# Patient Record
Sex: Female | Born: 1985 | Race: Asian | Hispanic: No | Marital: Married | Smoking: Never smoker
Health system: Southern US, Community
[De-identification: ages and names within clinical notes are randomized; demographics above are authoritative.]

## PROBLEM LIST (undated history)

## (undated) ENCOUNTER — Inpatient Hospital Stay: Payer: Self-pay

## (undated) DIAGNOSIS — Z789 Other specified health status: Secondary | ICD-10-CM

## (undated) HISTORY — PX: APPENDECTOMY: SHX54

---

## 2015-07-23 LAB — OB RESULTS CONSOLE RPR: RPR: NONREACTIVE

## 2015-07-23 LAB — OB RESULTS CONSOLE RUBELLA ANTIBODY, IGM: Rubella: IMMUNE

## 2015-07-23 LAB — OB RESULTS CONSOLE HEPATITIS B SURFACE ANTIGEN: Hepatitis B Surface Ag: NEGATIVE

## 2015-07-23 LAB — OB RESULTS CONSOLE HIV ANTIBODY (ROUTINE TESTING): HIV: NONREACTIVE

## 2015-07-23 LAB — OB RESULTS CONSOLE VARICELLA ZOSTER ANTIBODY, IGG: Varicella: IMMUNE

## 2016-01-21 NOTE — L&D Delivery Note (Signed)
Delivery Note At 5:01 AM a viable female was delivered via Vaginal, Spontaneous Delivery (Presentation: ROA).  APGAR: 8, 9; weight pending.   Placenta status: spontaneous, intact.  Cord: 3VC without complications.  Anesthesia:  None Episiotomy: None Lacerations: 2nd degree Suture Repair: 3.0 monocryl Est. Blood Loss (mL):  300mL  Mom to postpartum.  Baby to Couplet care / Skin to Skin.  Vinaya Sancho M 02/29/2016, 6:01 AM

## 2016-01-28 LAB — OB RESULTS CONSOLE GBS: GBS: NEGATIVE

## 2016-02-28 ENCOUNTER — Observation Stay
Admission: EM | Admit: 2016-02-28 | Discharge: 2016-02-28 | Disposition: A | Discharge status: Home / Self Care | Payer: BLUE CROSS/BLUE SHIELD | Source: Home / Self Care | Admitting: Obstetrics and Gynecology

## 2016-02-28 ENCOUNTER — Inpatient Hospital Stay
Admission: EM | Admit: 2016-02-28 | Discharge: 2016-03-02 | DRG: 775 | Disposition: A | Discharge status: Home / Self Care | Payer: BLUE CROSS/BLUE SHIELD | Attending: Obstetrics and Gynecology | Admitting: Obstetrics and Gynecology

## 2016-02-28 DIAGNOSIS — Z3A40 40 weeks gestation of pregnancy: Secondary | ICD-10-CM

## 2016-02-28 DIAGNOSIS — O479 False labor, unspecified: Secondary | ICD-10-CM | POA: Diagnosis present

## 2016-02-28 HISTORY — DX: Other specified health status: Z78.9

## 2016-02-28 LAB — OB RESULTS CONSOLE GBS: GBS: NEGATIVE

## 2016-02-28 NOTE — Discharge Instructions (Signed)
Discharge instructions given, and stated understanding. Labor precautions given.

## 2016-02-28 NOTE — OB Triage Note (Signed)
Pt arrived from ED to obs rm 3 with c/o contractions starting last night and increasing with pain (3/10 pain score) light pink tinged discharge this morning. No burling or irritation with urination, no recent sex, and last cervical exam was Monday. Pt placed on monitor and oriented to room.

## 2016-02-28 NOTE — Discharge Summary (Signed)
Physician Final Progress Note  Patient ID: Stacie Garrison MRN: [id number] DOB/AGE: 30-Feb-1987 30 y.o.  Admit date: 02/28/2016 Admitting provider: Tresea Mall, CNM Discharge date: 02/28/2016   Admission Diagnoses: G1P0 at [redacted]w[redacted]d with c/o contractions every 10 to 15 minutes since 11pm last night and some spotting since this morning at 5am. Pt admits positive fetal movement. She denies LOF.  Discharge Diagnoses:  Active Problems:   Indication for care in labor and delivery, antepartum IUP at [redacted]w[redacted]d with reactive NST and positive contractions, not in labor  History of Present Illness: The patient was admitted for observation, placed on monitors for NST, and she walked for several hours, cervical check on admission and prior to discharge. Interpreter services were used for admission process. Pt verbalized understanding of discharge orders without interpreter  Past Medical History:  Diagnosis Date  . Medical history non-contributory     Past Surgical History:  Procedure Laterality Date  . APPENDECTOMY      No current facility-administered medications on file prior to encounter.    No current outpatient prescriptions on file prior to encounter.    No Known Allergies  Social History   Social History  . Marital status: Married    Spouse name: N/A  . Number of children: N/A  . Years of education: N/A   Occupational History  . Not on file.   Social History Main Topics  . Smoking status: Never Smoker  . Smokeless tobacco: Never Used  . Alcohol use No  . Drug use: No  . Sexual activity: Yes   Other Topics Concern  . Not on file   Social History Narrative  . No narrative on file    Physical Exam: BP 116/73   Pulse 97   Temp 98.8 F (37.1 C) (Oral)   Gen: NAD CV: RRR Pulm: CTAB Pelvic: 3/80/0 posterior Toco: q 6-8 min Fetal Well Being: 130 bpm, moderate variability, +accelerations, -decelerations Ext: no edema  Consults: None  Significant Findings/ Diagnostic  Studies: none  Procedures: NST  Discharge Condition: good  Disposition: Final discharge disposition not confirmed  Diet: Regular diet  Discharge Activity: Activity as tolerated  Discharge Instructions    Discharge activity:  No Restrictions    Complete by:  As directed    Discharge diet:  No restrictions    Complete by:  As directed    Fetal Kick Count:  Lie on our left side for one hour after a meal, and count the number of times your baby kicks.  If it is less than 5 times, get up, move around and drink some juice.  Repeat the test 30 minutes later.  If it is still less than 5 kicks in an hour, notify your doctor.    Complete by:  As directed    LABOR:  When conractions begin, you should start to time them from the beginning of one contraction to the beginning  of the next.  When contractions are 5 - 10 minutes apart or less and have been regular for at least an hour, you should call your health care provider.    Complete by:  As directed    No sexual activity restrictions    Complete by:  As directed    Notify physician for bleeding from the vagina    Complete by:  As directed    Notify physician for blurring of vision or spots before the eyes    Complete by:  As directed    Notify physician for chills or  fever    Complete by:  As directed    Notify physician for fainting spells, "black outs" or loss of consciousness    Complete by:  As directed    Notify physician for increase in vaginal discharge    Complete by:  As directed    Notify physician for leaking of fluid    Complete by:  As directed    Notify physician for pain or burning when urinating    Complete by:  As directed    Notify physician for pelvic pressure (sudden increase)    Complete by:  As directed    Notify physician for severe or continued nausea or vomiting    Complete by:  As directed    Notify physician for sudden gushing of fluid from the vagina (with or without continued leaking)    Complete by:  As  directed    Notify physician for sudden, constant, or occasional abdominal pain    Complete by:  As directed    Notify physician if baby moving less than usual    Complete by:  As directed      Allergies as of 02/28/2016   No Known Allergies     Medication List    TAKE these medications   multivitamin-prenatal 27-0.8 MG Tabs tablet Take 1 tablet by mouth daily at 12 noon.        Total time spent taking care of this patient: 20 minutes  Signed: Tresea MallGLEDHILL,Khristi Schiller, CNM  02/28/2016, 1:36 PM

## 2016-02-29 DIAGNOSIS — Z3A40 40 weeks gestation of pregnancy: Secondary | ICD-10-CM | POA: Diagnosis not present

## 2016-02-29 DIAGNOSIS — Z3493 Encounter for supervision of normal pregnancy, unspecified, third trimester: Secondary | ICD-10-CM | POA: Diagnosis present

## 2016-02-29 LAB — TYPE AND SCREEN
ABO/RH(D): O POS
ANTIBODY SCREEN: NEGATIVE

## 2016-02-29 LAB — CBC
HCT: 34.4 % — ABNORMAL LOW (ref 35.0–47.0)
Hemoglobin: 11.7 g/dL — ABNORMAL LOW (ref 12.0–16.0)
MCH: 27.8 pg (ref 26.0–34.0)
MCHC: 34 g/dL (ref 32.0–36.0)
MCV: 81.6 fL (ref 80.0–100.0)
PLATELETS: 244 10*3/uL (ref 150–440)
RBC: 4.21 MIL/uL (ref 3.80–5.20)
RDW: 13.8 % (ref 11.5–14.5)
WBC: 20.9 10*3/uL — ABNORMAL HIGH (ref 3.6–11.0)

## 2016-02-29 MED ORDER — SENNOSIDES-DOCUSATE SODIUM 8.6-50 MG PO TABS: 2.0000 | ORAL_TABLET | ORAL | Status: DC

## 2016-02-29 MED ORDER — DIPHENHYDRAMINE HCL 25 MG PO CAPS: 25.0000 mg | ORAL_CAPSULE | Freq: Four times a day (QID) | ORAL | Status: DC | PRN

## 2016-02-29 MED ORDER — OXYTOCIN 40 UNITS IN LACTATED RINGERS INFUSION - SIMPLE MED
2.5000 [IU]/h | INTRAVENOUS | Status: DC
  Filled 2016-02-29: qty 1000

## 2016-02-29 MED ORDER — LACTATED RINGERS IV SOLN
INTRAVENOUS | Status: DC
  Administered 2016-02-29: 01:00:00 via INTRAVENOUS

## 2016-02-29 MED ORDER — IBUPROFEN 600 MG PO TABS
600.0000 mg | ORAL_TABLET | Freq: Four times a day (QID) | ORAL | Status: DC
  Administered 2016-02-29 – 2016-03-02 (×9): 600 mg via ORAL
  Filled 2016-02-29 (×10): qty 1

## 2016-02-29 MED ORDER — LIDOCAINE HCL (PF) 1 % IJ SOLN
30.0000 mL | INTRAMUSCULAR | Status: DC | PRN
  Administered 2016-02-29: 30 mL via SUBCUTANEOUS
  Filled 2016-02-29: qty 30

## 2016-02-29 MED ORDER — LACTATED RINGERS IV SOLN: 500.0000 mL | INTRAVENOUS | Status: DC | PRN

## 2016-02-29 MED ORDER — BENZOCAINE-MENTHOL 20-0.5 % EX AERO
1.0000 "application " | INHALATION_SPRAY | CUTANEOUS | Status: DC | PRN
  Filled 2016-02-29 (×2): qty 56

## 2016-02-29 MED ORDER — ONDANSETRON HCL 4 MG/2ML IJ SOLN: 4.0000 mg | INTRAMUSCULAR | Status: DC | PRN

## 2016-02-29 MED ORDER — BUTORPHANOL TARTRATE 1 MG/ML IJ SOLN
1.0000 mg | INTRAMUSCULAR | Status: DC | PRN
  Administered 2016-02-29 (×2): 1 mg via INTRAVENOUS
  Filled 2016-02-29 (×2): qty 1

## 2016-02-29 MED ORDER — ACETAMINOPHEN 325 MG PO TABS: 650.0000 mg | ORAL_TABLET | ORAL | Status: DC | PRN

## 2016-02-29 MED ORDER — AMMONIA AROMATIC IN INHA
RESPIRATORY_TRACT | Status: AC
  Filled 2016-02-29: qty 10

## 2016-02-29 MED ORDER — SOD CITRATE-CITRIC ACID 500-334 MG/5ML PO SOLN: 30.0000 mL | ORAL | Status: DC | PRN

## 2016-02-29 MED ORDER — OXYTOCIN BOLUS FROM INFUSION
500.0000 mL | Freq: Once | INTRAVENOUS | Status: AC
  Administered 2016-02-29: 500 mL via INTRAVENOUS

## 2016-02-29 MED ORDER — WITCH HAZEL-GLYCERIN EX PADS: 1.0000 "application " | MEDICATED_PAD | CUTANEOUS | Status: DC | PRN

## 2016-02-29 MED ORDER — DIBUCAINE 1 % RE OINT
1.0000 | TOPICAL_OINTMENT | RECTAL | Status: DC | PRN
Start: 2016-02-29 — End: 2016-03-02

## 2016-02-29 MED ORDER — ONDANSETRON HCL 4 MG/2ML IJ SOLN: 4.0000 mg | Freq: Four times a day (QID) | INTRAMUSCULAR | Status: DC | PRN

## 2016-02-29 MED ORDER — OXYCODONE-ACETAMINOPHEN 5-325 MG PO TABS: 1.0000 | ORAL_TABLET | ORAL | Status: DC | PRN

## 2016-02-29 MED ORDER — PRENATAL MULTIVITAMIN CH
1.0000 | ORAL_TABLET | Freq: Every day | ORAL | Status: DC
  Administered 2016-02-29 – 2016-03-02 (×3): 1 via ORAL
  Filled 2016-02-29 (×3): qty 1

## 2016-02-29 MED ORDER — SIMETHICONE 80 MG PO CHEW: 80.0000 mg | CHEWABLE_TABLET | ORAL | Status: DC | PRN

## 2016-02-29 MED ORDER — SENNOSIDES-DOCUSATE SODIUM 8.6-50 MG PO TABS
2.0000 | ORAL_TABLET | ORAL | Status: DC
  Administered 2016-03-01 – 2016-03-02 (×2): 2 via ORAL
  Filled 2016-02-29 (×2): qty 2

## 2016-02-29 MED ORDER — ONDANSETRON HCL 4 MG PO TABS: 4.0000 mg | ORAL_TABLET | ORAL | Status: DC | PRN

## 2016-02-29 MED ORDER — COCONUT OIL OIL: 1.0000 "application " | TOPICAL_OIL | Status: DC | PRN

## 2016-02-29 MED ORDER — MISOPROSTOL 200 MCG PO TABS
ORAL_TABLET | ORAL | Status: AC
  Filled 2016-02-29: qty 4

## 2016-02-29 MED ORDER — OXYCODONE-ACETAMINOPHEN 5-325 MG PO TABS: 2.0000 | ORAL_TABLET | ORAL | Status: DC | PRN

## 2016-02-29 NOTE — H&P (Signed)
Obstetric H&P   Chief Complaint: Contractions  Prenatal Care Provider: [facility]  History of Present Illness: 31 y.o. G1P0 6928w4d by LMP=10 week US derived EDC of 02/25/2016, presenting to L&D with contractions.  Seen earlier today with contractions and pink discharged.  Observed without cervical change over that time frame at approximately 3cm of dilation.  Represents this evening with intensifying contractions. No LOF, no VB, +FM.  Cervix 5cm with bulging bag on admission.  PNL O pos / ABSC neg / RI / VZI / HIV neg / HBsAg neg / RPR NR / 1-hr 123 / GBS negative  TDAP UTD received 01/02/16  Review of Systems: 10 point review of systems negative unless otherwise noted in HPI  Past Medical History: Past Medical History:  Diagnosis Date  . Medical history non-contributory     Past Surgical History: Past Surgical History:  Procedure Laterality Date  . APPENDECTOMY      Family History: No family history on file.  Social History: Social History   Social History  . Marital status: Married    Spouse name: N/A  . Number of children: N/A  . Years of education: N/A   Occupational History  . Not on file.   Social History Main Topics  . Smoking status: Never Smoker  . Smokeless tobacco: Never Used  . Alcohol use No  . Drug use: No  . Sexual activity: Yes   Other Topics Concern  . Not on file   Social History Narrative  . No narrative on file    Medications: Prior to Admission medications   Medication Sig Start Date End Date Taking? Authorizing Provider  Prenatal Vit-Fe Fumarate-FA (MULTIVITAMIN-PRENATAL) 27-0.8 MG TABS tablet Take 1 tablet by mouth daily at 12 noon.    Historical Provider, MD    Allergies: No Known Allergies  Physical Exam: Vitals: There were no vitals taken for this visit.  Urine Dip Protein: N/A  FHT: 140, moderate, no acceleration, possible early decelerations Toco: q793min  General: NAD HEENT: normocephalic, anicteric Pulmonary: No increased  work of breathing Cardiovascular: RRR, distal pulses 2+ Abdomen: Gravid, non-tender Leopolds: vtx 7lbs Genitourinary: . Dilation: 5 Effacement (%): 100 Station: 0, -1 Presentation: Vertex Exam by:: MT  Extremities: no edema, erythema, or tenderness Neurologic: Grossly intact Psychiatric: mood appropriate, affect full  Labs: No results found for this or any previous visit (from the past 24 hour(s)).  Assessment: 31 y.o. G1P0 3428w4d by 02/25/2016, by LMP=10w US presenting in term labor  Plan: 1) Labor - expectant management  2) Fetus -cat I tracing  3) PNL - O pos / ABSC neg / RI / VZI / HIV neg / HBsAg neg / RPR NR / 1-hr 123 / GBS negative  4) TDAP - UTD   5) Disposition - pending delivery

## 2016-02-29 NOTE — Progress Notes (Signed)
Subjective:  Painful contractions  Objective:   Vitals: Blood pressure 114/74, pulse (!) 112, temperature 98.5 F (36.9 C), temperature source Oral, resp. rate 18, height 5' (1.524 m), weight 150 lb (68 kg). General: NAD Abdomen: gravid, non-tender Cervical Exam:  Dilation: 7 Effacement (%): 100 Station: 0 Presentation: Vertex Exam by:: Dr. Bonney AidStaebler AROM clear  FHT: 150, moderate, no accels occasional early decels Toco: q2-393min  Results for orders placed or performed during the hospital encounter of 02/28/16 (from the past 24 hour(s))  CBC     Status: Abnormal   Collection Time: 02/29/16 12:58 AM  Result Value Ref Range   WBC 20.9 (H) 3.6 - 11.0 K/uL   RBC 4.21 3.80 - 5.20 MIL/uL   Hemoglobin 11.7 (L) 12.0 - 16.0 g/dL   HCT 16.134.4 (L) 09.635.0 - 04.547.0 %   MCV 81.6 80.0 - 100.0 fL   MCH 27.8 26.0 - 34.0 pg   MCHC 34.0 32.0 - 36.0 g/dL   RDW 40.913.8 81.111.5 - 91.414.5 %   Platelets 244 150 - 440 K/uL    Assessment:   31 y.o. G1P0 4426w4d term labor  Plan:   1) Labor - AROM clear fluid.  Making good cervical change  2) Fetus - cat I tracing

## 2016-02-29 NOTE — Progress Notes (Signed)
Interpreter used on admission [place] [id number] , viet[place] interpreter on a stick

## 2016-02-29 NOTE — Discharge Summary (Signed)
Obstetric Discharge Summary  Reason for Admission: onset of labor Prenatal Procedures: none Intrapartum Procedures: spontaneous vaginal delivery Postpartum Procedures: none Complications-Operative and Postpartum: none Hemoglobin  Date Value Ref Range Status  03/01/2016 9.6 (L) 12.0 - 16.0 g/dL Final   HCT  Date Value Ref Range Status  03/01/2016 29.2 (L) 35.0 - 47.0 % Final    Physical Exam:  General: alert, appears stated age and no distress Lochia: appropriate Uterine Fundus: firm  DVT Evaluation: No evidence of DVT seen on physical exam.  Prenatal Labs: O+, Rubella Immune, Varicella Immune TDAP UTD Contraception: Depo Provera 1st injection at discharge  Discharge Diagnoses: Term Pregnancy-delivered  Discharge Information: Date: 03/02/2016 Activity: pelvic rest Diet: routine Allergies as of 03/02/2016   No Known Allergies    Take prenatal vitamin daily   Condition: stable Discharge to: home Follow-up Information    Lorrene ReidSTAEBLER, ANDREAS M, MD Follow up in 6 week(s).   Specialty:  Obstetrics and Gynecology Why:  postpartum visit Contact information: [address] [place] [address] [phone]           Newborn Data: Live born female  Birth Weight:  3250g APGAR: 9/9 Feeding: Formula  Home with mother.  Discharge completed with the services of [place] interpreter  Signed by Tresea MallGLEDHILL,Felicie Kocher, CNM

## 2016-03-01 LAB — CBC
HCT: 29.2 % — ABNORMAL LOW (ref 35.0–47.0)
Hemoglobin: 9.6 g/dL — ABNORMAL LOW (ref 12.0–16.0)
MCH: 27.6 pg (ref 26.0–34.0)
MCHC: 33 g/dL (ref 32.0–36.0)
MCV: 83.6 fL (ref 80.0–100.0)
PLATELETS: 269 10*3/uL (ref 150–440)
RBC: 3.49 MIL/uL — ABNORMAL LOW (ref 3.80–5.20)
RDW: 13.8 % (ref 11.5–14.5)
WBC: 20.1 10*3/uL — AB (ref 3.6–11.0)

## 2016-03-01 LAB — RPR: RPR: NONREACTIVE

## 2016-03-01 NOTE — Progress Notes (Signed)
Post Partum Day 1 Subjective: Doing well, no complaints.  Tolerating regular diet, pain with PO meds, voiding and ambulating without difficulty.  No CP SOB F/C N/V or leg pain No HA, change of vision, RUQ/epigastric pain  Objective: BP (!) 90/58 (BP Location: Right Arm)   Pulse 85   Temp 97.7 F (36.5 C) (Oral)   Resp 16   Ht 5' (1.524 m)   Wt 150 lb (68 kg)   SpO2 99%   BMI 29.29 kg/m    Physical Exam:  General: NAD CV: RRR Pulm: nl effort, CTABL Lochia: moderate Uterine Fundus: fundus firm and below umbilicus DVT Evaluation: no cords, ttp LEs    Recent Labs  02/29/16 0058 03/01/16 0526  HGB 11.7* 9.6*  HCT 34.4* 29.2*  WBC 20.9* 20.1*  PLT 244 269    Assessment/Plan: 31 y.o. G1P0 postpartum day # 1  1. Continue routine postpartum care 2. O positive, Rubella Immune, Varicella Immune 3. TDAP UTD 4. Formula feeding/Contraception: uncertain at this time 5. Disposition: discharge to home tomorrow   Tresea MallGLEDHILL,Taliah Porche, CNM

## 2016-03-02 MED ORDER — MEDROXYPROGESTERONE ACETATE 150 MG/ML IM SUSP
150.0000 mg | Freq: Once | INTRAMUSCULAR | Status: AC
  Administered 2016-03-02: 150 mg via INTRAMUSCULAR
  Filled 2016-03-02: qty 1

## 2016-03-02 NOTE — Progress Notes (Signed)
Patient discharged home with infant and significant other. Discharge instructions, prescriptions and follow up appointment given to and reviewed with patient and significant other via interpreter. Patient verbalized understanding. Escorted out via wheelchair by Velda ShellBrielle Ahley Bulls, RN.

## 2016-03-02 NOTE — Discharge Instructions (Signed)
Sinh con qua ????ng âm ?a?o, Ch?m so?c sau sinh °(Vaginal Delivery, Care After) °Hãy tham kh?o t? thông tin này trong vài tu?n t?i. Nh?ng h??ng d?n này cung c?p cho quý v? thông tin v? cách ch?m sóc b?n thân sau khi sinh con qua ???ng âm ?a?o. Chuyên gia ch?m sóc s?c kh?e c?ng có th? có h??ng d?n c? th? h?n cho quý v?. Vi?c ?i?u tr? c?a quý v? ?ã ???c lên k? ho?ch theo th?c hành y khoa hi?n t?i, nh?ng v?n ?? ?ôi khi v?n x?y ra. Hãy g?i cho chuyên gia ch?m sóc s?c kh?e n?u quý v? có b?t k? v?n ?? ho?c th?c m?c nào. °D?? KIÊ?N ?IÊ?U GI? XA?Y RA SAU KHI SINH °Sau khi sinh con qua ????ng âm ?a?o, thông th???ng se? co?: °· Cha?y ma?u mô?t chu?t ?? âm ?a?o. °· ?au nh??c ?? bu?ng, âm ?a?o va? vu?ng da gi??a c?a âm ?a?o va? hâ?u môn (?a?y châ?u). °· Co th??t vu?ng châ?u. °· M?t m?i. °H??NG D?N CH?M SÓC T?I NHÀ °Thuô?c °· Ch? s? d?ng thu?c không c?n kê ??n và thu?c c?n kê ??n theo ch? d?n c?a chuyên gia ch?m sóc s?c kh?e. °· N?u quý v? ???c kê thu?c kháng sinh, hãy dùng thu?c theo ch? d?n c?a chuyên gia ch?m sóc s?c kh?e. Không d??ng du?ng thuô?c kha?ng sinh cho ?ê?n khi hê?t thuô?c. °La?i xe °· Không lái xe ho?c v?n hành máy móc h?ng n?ng trong khi dùng thu?c gi?m ?au ???c kê ??n. °· Không lái xe trong vòng 24 gi? n?u quý v? ?ã dùng thu?c an th?n. °L?i s?ng °· Không u?ng r??u. ?iê?u na?y ??c biê?t quan tro?ng nê?u quy? vi? cho con bu? b??ng s??a me? ho??c ?ang du?ng thuô?c gia?m ?au. °· Không s? d?ng các s?n ph?m thu?c lá nào, bao g?m thu?c lá d?ng hút, thu?c lá d?ng nhai ho?c thu?c lá ?i?n t?. N?u quý v? c?n giúp ?? ?? cai thu?c, hãy h?i chuyên gia ch?m sóc s?c kh?e. °?n và u?ng °· Uô?ng i?t nhâ?t 8 ly mô?i ly ta?m au-x? n???c mô?i nga?y tr?? khi chuyên gia ch?m so?c s??c kho?e ba?o quy? vi? không la?m vâ?y. Nê?u quy? vi? ch?n nuôi con b??ng s??a me?, quy? vi? co? thê? câ?n uô?ng nhiê?u n???c h?n m??c na?y. °· ?n ca?c th??c phâ?m nhiê?u châ?t x? mô?i nga?y. Nh??ng th??c phâ?m na?y co? thê? tra?nh ho??c gia?m ta?o bo?n.  Th??c phâ?m nhiê?u châ?t x? bao gô?m: °? Ngu? cô?c va? ba?nh mi? nguyên ca?m. °? Nga?o l??t. °? ??u. °? Trái cây và rau qua?. °Hoa?t ?ô?ng °· Tr? l?i sinh ho?t bình th??ng theo ch? d?n c?a chuyên gia ch?m sóc s?c kh?e. H?i chuyên gia ch?m sóc s?c kh?e v? các ho?t ??ng nào an toàn cho quý v?. °· Ngh? ng?i càng nhi?u càng t?t. Cô? g??ng nghi? ng?i va? ngu? ch??p m??t khi con quy? vi? ngu?. °· Không nâng bâ?t ky? vâ?t gi? n??ng h?n con quy? vi? ho??c quá 10 lb (4,5 kg) cho ?ê?n khi chuyên gia ch?m so?c s??c kho?e no?i la?m vâ?y la? an toa?n. °· No?i chuyê?n v??i chuyên gia ch?m so?c s??c kho?e cu?a quy? vi? vê? viê?c khi na?o quy? vi? co? thê? quan hê? ti?nh du?c. ?iê?u na?y phu? thuô?c va?o: °? Nguy c? nhiê?m tru?ng. °? Tô?c ?ô? li?n. °? C?m giác thoa?i ma?i va? mong muô?n quan hê? ti?nh du?c. °Ch?m so?c âm ?a?o °· Nê?u quy? vi? bi? ra?ch âm hô? ho??c bi? ra?ch âm ?a?o, ha?y kiê?m tra   khu v??c ?o? mô?i nga?y xem co? dâ?u hiê?u nhiê?m tru?ng không. Kiê?m tra xem: °? T?y ??, s?ng n?, ho?c ?au nhi?u h?n. °? Nhi?u ch?t d?ch ho?c máu h?n. °? ?m. °? M? ho?c mùi hôi. °· Không s?? du?ng nu?t b?ng vê? sinh ho??c thu?t r??a cho ?ê?n khi chuyên gia ch?m so?c s??c kho?e no?i viê?c na?y la? an toa?n. °· Nhi?n xem co? bâ?t ky? cu?c ma?u na?o ma? co? thê? trôi ra t?? âm ?a?o không. Nh??ng cu?c ma?u na?y co? thê? trông nh? nh??ng cu?c khi? h? ?o? thâ?m, nâu ho??c ?en. °H??ng d?n chung °· Gi? cho vu?ng ?a?y châ?u s?ch và khô theo ch? d?n c?a chuyên gia ch?m sóc s?c kh?e. °· M??c quâ?n a?o rô?ng, thoa?i ma?i. °· Lau t?? tr???c ra sau khi quy? vi? ?i vê? sinh. °· Hãy h?i chuyên gia ch?m sóc s?c kh?e xem quý v? có th? t?m vòi hoa sen hay t??m bô?n không. Nê?u quy? vi? bi? ra?ch âm hô? ho??c ra?ch ?a?y châ?u trong lu?c sinh con, chuyên gia ch?m so?c s??c kho?e co? thê? no?i quy? vi? không t??m bô?n trong mô?t khoa?ng th??i gian nhâ?t ?i?nh. °· M??c a?o ng??c hô? tr?? cho vu? va? v??a v??n v??i quy? vi?. °· Nê?u co? thê?, ha?y nh?? ai  giu?p quy? vi? viê?c nha? va? giu?p ch?m so?c con quy? vi? trong i?t nhâ?t va?i nga?y sau khi quy? vi? ra viê?n. °· Tuân th? t?t c? các cu?c h?n khám l?i cho quy? vi? va? con quy? vi? theo ch? d?n c?a chuyên gia ch?m sóc s?c kh?e. ?i?u này có vai trò quan tr?ng. °?I KHÁM N?U: °· Quy? vi? b?: °? Khi? h? ?? âm ?a?o ma? co? mu?i hôi. °? ?i tiê?u khó kh?n. °? ?au khi ?i ti?u. °? ?ô?t ngô?t t?ng ho??c gia?m sô? lâ?n ?a?i tiê?n. °? ?o?, s?ng ho??c ?au nhiê?u h?n quanh chô? ra?ch âm hô? hay chô? ra?ch âm ?a?o. °? Nhiê?u châ?t di?ch ho??c nhi?u ma?u cha?y ra t?? chô? ra?ch âm hô? ho??c chô? ra?ch âm ?a?o h?n. °? Co? mu? ho??c mu?i hôi toa?t ra t?? chô? ra?ch âm hô? ho??c chô? ra?ch âm ?a?o. °? S?t. °? Phát ban. °? I?t quan tâm ho??c không quan tâm ?ê?n ca?c hoa?t ?ô?ng ma? quy? vi? t??ng thi?ch thu?. °? Ca?c câu ho?i vê? ch?m so?c ba?n thân va? con quy? vi?. °· Chô? ra?ch âm hô? ho??c chô? ra?ch âm ?a?o ca?m thâ?y â?m khi cha?m va?o. °· Chô? ra?ch âm hô? ho??c chô? ra?ch âm ?a?o vâ?n bi? h?? va? không co? ve? liê?n la?i. °· Vu? cu?a quy? vi? ?au, c??ng ho??c chuy?n sang màu ?o?. °· Quy? vi? ca?m thâ?y buô?n ho??c lo l??ng bâ?t th???ng. °· Quý v? c?m th?y bu?n nôn ho?c quý v? nôn. °· Quý v? có các c?c máu ?ông l?n trôi ra t? âm ??o. Nê?u quy? vi? co? mô?t cu?c ma?u ?ông trôi ra t?? âm ?a?o, ha?y gi?? la?i ?ê? cho chuyên gia ch?m so?c s??c kho?e cu?a quy? vi? xem. Không xa? trôi ca?c cu?c ma?u ?ông xuô?ng bô?n câ?u ma? không nh?? chuyên gia ch?m so?c s??c kho?e kiê?m tra. °· Quý v? ?i ti?u nhi?u h?n bình th??ng. °· Quy? vi? b? chóng m?t ho?c choa?ng va?ng. °· Quy? vi? không hê? cho con bu? va? quy? vi? ch?a thâ?y kinh trong 12 tuâ?n sau khi sinh con. °· Quy? vi? d??ng cho con bu? va? quy? vi? ch?a thâ?y kinh trong 12 tuâ?n sau khi ng??ng cho con bu?. ° °NGAY   L?P T?C ?I KHÁM N?U: °· Quy? vi? bi?: °? ?au ma? không hê?t ho?c không ??? sau khi du?ng thuô?c. °? ?au ng?c. °? Khó th?. °? Nhi?n m?? ho?c nhi?n thâ?y ?ô?m. °? Y? nghi?  vê? vi?c la?m tô?n th??ng cho ba?n thân ho?c con quy? vi?. °· Quý v? b? ?au b?ng ho?c ?au ? mô?t trong hai chân. °· Quý v? b? ?au ??u d? d?i. °· Quý v? b? ng?t. °· Quy? vi? bi? cha?y ma?u ?? âm ?a?o nhiê?u ?ê?n m??c quy? vi? thâ?m ???t hai b?ng vê? sinh trong vo?ng mô?t gi??. ° °Thông tin này không nh?m m?c ?ích thay th? cho l?i khuyên mà chuyên gia ch?m sóc s?c kh?e nói v?i quý v?. Hãy b?o ??m quý v? ph?i th?o lu?n b?t k? v?n ?? gì mà quý v? có v?i chuyên gia ch?m sóc s?c kh?e c?a quý v?. °Document Released: 01/06/2005 Document Revised: 04/30/2015 Document Reviewed: 01/21/2015 °Elsevier Interactive Patient Education © 2017 [employer] ° °

## 2016-04-08 ENCOUNTER — Other Ambulatory Visit: Payer: Self-pay | Admitting: Obstetrics and Gynecology

## 2016-04-08 ENCOUNTER — Encounter: Payer: Self-pay | Admitting: Obstetrics and Gynecology

## 2016-04-08 MED ORDER — MEDROXYPROGESTERONE ACETATE 150 MG/ML IM SUSP: 150.0000 mg | INTRAMUSCULAR | 3 refills | Status: DC

## 2016-05-05 ENCOUNTER — Ambulatory Visit (INDEPENDENT_AMBULATORY_CARE_PROVIDER_SITE_OTHER): Payer: BLUE CROSS/BLUE SHIELD | Admitting: Advanced Practice Midwife

## 2016-05-05 ENCOUNTER — Encounter: Payer: Self-pay | Admitting: Advanced Practice Midwife

## 2016-05-05 VITALS — BP 100/60 | HR 82 | Wt 130.0 lb

## 2016-05-05 DIAGNOSIS — N390 Urinary tract infection, site not specified: Secondary | ICD-10-CM | POA: Diagnosis not present

## 2016-05-05 DIAGNOSIS — R300 Dysuria: Secondary | ICD-10-CM

## 2016-05-05 LAB — POCT URINALYSIS DIPSTICK
Bilirubin, UA: NEGATIVE
Glucose, UA: NEGATIVE
KETONES UA: NEGATIVE
Nitrite, UA: NEGATIVE
PH UA: 6 (ref 5.0–8.0)
PROTEIN UA: NEGATIVE
RBC UA: NEGATIVE
Spec Grav, UA: 1.015 (ref 1.010–1.025)
UROBILINOGEN UA: NEGATIVE U/dL — AB

## 2016-05-05 MED ORDER — CEPHALEXIN 500 MG PO CAPS: 500.0000 mg | ORAL_CAPSULE | Freq: Two times a day (BID) | ORAL | 0 refills | Status: AC

## 2016-05-05 NOTE — Progress Notes (Signed)
PCP:No PCP Per Patient Chief Complaint  Patient presents with  . pain and bleeding    dysuria     Current Issues:  Presents with 3 days of dysuria and urinary frequency Associated symptoms include:  lower abdominal pain Pt is also still bleeding following recent delivery in February. She has been on depo provera for birth control. She has a complaint of fullness in her vagina after being on her feet all day at work.  Sexually active:  Yes with female.   No concern for STI.  Prior to Admission medications   Medication Sig Start Date End Date Taking? Authorizing Provider  medroxyPROGESTERone (DEPO-PROVERA) 150 MG/ML injection Inject 1 mL (150 mg total) into the muscle every 3 (three) months. 04/08/16  Yes Vena Austria, MD  cephALEXin (KEFLEX) 500 MG capsule Take 1 capsule (500 mg total) by mouth 2 (two) times daily. 05/05/16 05/12/16  Tresea Mall, CNM  Prenatal Vit-Fe Fumarate-FA (MULTIVITAMIN-PRENATAL) 27-0.8 MG TABS tablet Take 1 tablet by mouth daily at 12 noon.    Historical Provider, MD    Review of Systems: negative except as noted in HPI  PE:  BP 100/60   Pulse 82   Wt 130 lb (59 kg)   BMI 25.39 kg/m  Constitutional: well nourished, no acute distress Heart: regular rate and rhythm Lungs: bilateral clear to auscultation Back: non tender Abdomen: mildly tender to palpation Pelvic: vaginal fullness, scant blood in vault, no cmt, no evidence of organ prolapse  Results for orders placed or performed in visit on 05/05/16  POCT urinalysis dipstick  Result Value Ref Range   Color, UA yellow    Clarity, UA clear    Glucose, UA neg    Bilirubin, UA neg    Ketones, UA neg    Spec Grav, UA 1.015 1.010 - 1.025   Blood, UA neg    pH, UA 6.0 5.0 - 8.0   Protein, UA neg    Urobilinogen, UA negative (A) 0.2 or 1.0 E.U./dL   Nitrite, UA neg    Leukocytes, UA Moderate (2+) (A) Negative    Assessment and Plan:  1. Dysuria - POCT urinalysis dipstick - Urine culture -  cephALEXin (KEFLEX) 500 MG capsule; Take 1 capsule (500 mg total) by mouth 2 (two) times daily.  Dispense: 14 capsule; Refill: 0  2. Acute urinary tract infection - Urine culture - cephALEXin (KEFLEX) 500 MG capsule; Take 1 capsule (500 mg total) by mouth 2 (two) times daily.  Dispense: 14 capsule; Refill: 0  Recommend Kegel exercises for pelvic floor health Increase hydration   Tresea Mall, CNM

## 2016-05-05 NOTE — Patient Instructions (Signed)
Nhi?m tru?ng ????ng ti?t ni?u, Ng???i l??n (Urinary Tract Infection, Adult) Nhi?m tru?ng ????ng ti?u (UTI) la? m?t b?nh nhi?m tru?ng ?? b?t c?? ph?n na?o cu?a ????ng ti?u, bao g?m th?n, ni?u qua?n, ba?ng quang va? ni?u ?a?o. Nh??ng c? quan na?y ta?o ra, ch??a va? tha?i n???c ti?u trong c? th?. UTI co? th? la? m?t nhi?m tru?ng ba?ng quang (vim ba?ng quang) ho??c nhi?m tru?ng th?n (vim b? th?n). NGUYN NHN Nhi?m tru?ng na?y co? th? do n?m, vi ru?t ho??c vi khu?n gy nn. Vi khu?n la? nguyn nhn ph? bi?n nh?t cu?a UTI. B?nh na?y cu?ng co? th? xa?y ra khi th???ng xuyn khng ?i h?t n???c ti?u trong ba?ng quang khi ?i ti?u. CC Y?U T? NGUY C? Tnh tr?ng ny hay x?y ra h?n n?u:  Quy? vi? nhi?n ?i ti?u ho??c khng ?i ti?u trong th??i gian da?i.  Quy? vi? khng ?i h?t n???c ti?u trong ba?ng quang khi ?i ti?u.  Quy? vi? lau t?? ???ng sau ra ???ng tr???c sau khi ?i ti?u ho??c ?i ?a?i ti?n, n?u quy? vi? la? phu? n??Anselmo Rod.  Quy? vi? khng c??t bao quy ??u, n?u quy? vi? la? nam gi??i.  Qu v? b? to bn.  Quy? vi? ?ang co? m?t ?ng thng ti?u (n??m trong).  Quy? vi? c h? th?ng phng v? (mi?n d?ch) y?u.  Quy? vi? co? m?t b?nh la?m a?nh h???ng ??n ru?t, th?n ho??c ba?ng quang.  Qu v? b? ti?u ???ng.  Anselmo RodQuy? vi? du?ng thu?c kha?ng sinh th???ng xuyn ho??c trong nh??ng khoa?ng th??i gian da?i va? thu?c kha?ng sinh khng cn ta?c du?ng v??i m?t s? loa?i nhi?m tru?ng nh?t ??nh (kha?ng thu?c kha?ng sinh).  Quy? vi? du?ng thu?c la?m ki?ch thi?ch ????ng ti?u.  Quy? vi? ti?p xu?c v??i ho?a ch?t la?m ki?ch thi?ch ????ng ti?u.  Quy? vi? la? phu? n??. TRI?U CH?NG Nh?ng tri?u ch?ng c?a tnh tr?ng ny bao g?m:  S?t.  ?i ti?u th???ng xuyn ho??c ?i l???ng nho? n???c ti?u th???ng xuyn.  R?t bu?n ?i ti?u.  ?au ho??c no?ng rt khi ?i ti?u.  N???c ti?u co? mu?i hi ho??c b?t th???ng.  N???c ti?u v?n ?u?c.  ?au ? vng b?ng d??i  ho??c l?ng.  Kho? ?i ti?u.  ?i ti?u ra mu.  Nn ho??c i?t ?o?i h?n bi?nh th???ng.  Tiu cha?y ho??c ?au bu?ng.  Kh h? ?? m ?a?o, n?u quy? vi? la? phu? n??. CH?N ?ON Tnh tr?ng ny c th? ???c ch?n ?on d?a vo khai thc b?nh s? v khm th?c th?. Quy? vi? cu?ng se? c?n l?y m?u n???c ti?u ?? xe?t nghi?m. Cc ki?m tra c?ng c th? ???c th?c hi?n, bao g?m:  Xt nghi?m mu.  Ki?m tra b?nh ly truy?n qua ???ng tnh d?c (STD). N?u quy? vi? bi? nhi?u h?n m?t b?nh nhi?m trng ???ng ti?t ni?u (UTI), quy? vi? co? th? c?n ph?i ???c soi ba?ng quang ho??c ca?c ki?m tra hi?nh a?nh ?? xa?c ?i?nh nguyn nhn gy nhi?m tru?ng. ?I?U TR? Vi?c ?i?u tri? b?nh na?y th???ng bao g?m k?t h??p t? hai bi?n pha?p tr? ln sau ?y:  Thu?c khng sinh.  Ca?c thu?c kha?c ?? ?i?u tri? ca?c nguyn nhn i?t ph? bi?n h?n gy nhi?m trng ???ng ti?t ni?u (UTI).  Thu?c gi?m ?au khng c?n k ??n.  U?ng ?u? n???c ?? c? th? lun ?u? n???c. H??NG D?N CH?M [place] T?I NH  Ch? s? d?ng thu?c khng c?n k ??n v thu?c c?n k ??n theo ch? d?n c?a chuyn gia ch?m [place] s?c kh?e.  N?u  qu v? ???c k thu?c khng sinh, hy dng thu?c theo ch? d?n c?a chuyn gia ch?m [place] s?c kh?e. Khng d?ng u?ng thu?c khng sinh ngay c? khi qu v? b?t ??u c?m th?y ?? h?n.  Trnh u?ng r???u, caffein, tr v cc ?? u?ng c ga. Chu?ng co? th? ki?ch thi?ch ba?ng quang c?a quy? vi?.  U?ng ?? n??c ?? gi? cho n??c ti?u trong ho?c c mu vng nh?t.  Tun th? t?t c? cc cu?c h?n khm l?i theo ch? d?n c?a chuyn gia ch?m [place] s?c kh?e. ?i?u ny c vai tr quan tr?ng.  Ha?y ba?o ?a?m:  ?i ti?u th???ng xuyn va? h?t bi. Khng nhi?n ?i ti?u trong th??i gian da?i.  ?i ti?u tr???c va? sau khi quan h? ti?nh du?c.  Lau t?? tr???c ra sau sau khi ?i ?a?i ti?n n?u quy? vi? la? phu? n??. S?? du?ng m?i t? kh?n gi?y m?t l?n quy? vi? lau. ?I KHM N?U:  Quy? vi? bi? ?au l?ng.  Qu v? b? s?t.  Qu v? c?m th?y bu?n  nn ho?c nn.  Cc tri?u ch?ng c?a qu v? khng ?? h?n sau 3 ngy.  Cc tri?u ch?ng h?t v sau ? xu?t hi?n tr? l?i. NGAY L?P T?C ?I KHM N?U:  Qu v? b? ?au l?ng ho?c ?au b?ng d??i r?t nhi?u.  Quy? vi? nn ho??c khng th? gi?? ???c thu?c ho??c n???c trong ng???i. Thng tin ny khng nh?m m?c ?ch thay th? cho l?i khuyn m chuyn gia ch?m [place] s?c kh?e ni v?i qu v?. Hy b?o ??m qu v? ph?i th?o lu?n b?t k? v?n ?? g m qu v? c v?i chuyn gia ch?m [place] s?c kh?e c?a qu v?. Document Released: 01/06/2005 Document Revised: 04/30/2015 Document Reviewed: 11/27/2014 Elsevier Interactive Patient Education  2017 [employer].

## 2016-05-07 LAB — URINE CULTURE: Organism ID, Bacteria: NO GROWTH

## 2016-05-20 ENCOUNTER — Ambulatory Visit: Payer: BLUE CROSS/BLUE SHIELD

## 2016-05-22 ENCOUNTER — Ambulatory Visit: Payer: BLUE CROSS/BLUE SHIELD

## 2017-07-07 ENCOUNTER — Ambulatory Visit (INDEPENDENT_AMBULATORY_CARE_PROVIDER_SITE_OTHER): Payer: BLUE CROSS/BLUE SHIELD | Admitting: Advanced Practice Midwife

## 2017-07-07 ENCOUNTER — Encounter: Payer: Self-pay | Admitting: Advanced Practice Midwife

## 2017-07-07 VITALS — BP 102/72 | HR 88 | Ht 60.0 in | Wt 126.0 lb

## 2017-07-07 DIAGNOSIS — Z538 Procedure and treatment not carried out for other reasons: Secondary | ICD-10-CM

## 2017-07-07 NOTE — Progress Notes (Signed)
The patient is here today on referral from her primary care doctor for PAP smear. Patient tells me she had a normal PAP smear last year and that she has always had a normal PAP smear and she prefers 3 year interval on PAP smear. She also has a question about fertility while on Depo.   There was no exam done today based on the above information. Interpreter was present and patient verbalized understanding.   Tresea MallJane Kamonte Mcmichen, CNM

## 2018-03-11 LAB — LIPID PANEL
Cholesterol: 123 (ref 0–200)
HDL: 42 (ref 35–70)
Triglycerides: 56 (ref 40–160)

## 2018-03-11 LAB — BASIC METABOLIC PANEL
BUN: 13 (ref 4–21)
Creatinine: 0.7 (ref <=1.1)
Glucose: 82
Potassium: 5.1 (ref 3.4–5.3)
Sodium: 141 (ref 137–147)

## 2018-03-11 LAB — HEPATIC FUNCTION PANEL
AST: 16 (ref 13–35)
Alkaline Phosphatase: 64 (ref 25–125)

## 2018-03-11 LAB — TSH: TSH: 0.66 (ref <=5.90)

## 2018-03-11 LAB — CBC AND DIFFERENTIAL
HCT: 38 (ref 36–46)
Hemoglobin: 12.7 (ref 12.0–16.0)
Platelets: 104 — AB (ref 150–399)
WBC: 6.8

## 2018-03-11 LAB — VITAMIN D 25 HYDROXY (VIT D DEFICIENCY, FRACTURES): Vit D, 25-Hydroxy: 16

## 2018-06-23 ENCOUNTER — Encounter: Payer: BLUE CROSS/BLUE SHIELD | Admitting: Certified Nurse Midwife

## 2018-06-28 ENCOUNTER — Other Ambulatory Visit: Payer: Self-pay

## 2018-06-28 ENCOUNTER — Ambulatory Visit (INDEPENDENT_AMBULATORY_CARE_PROVIDER_SITE_OTHER): Payer: Self-pay | Admitting: Certified Nurse Midwife

## 2018-06-28 ENCOUNTER — Encounter: Payer: Self-pay | Admitting: Certified Nurse Midwife

## 2018-06-28 VITALS — BP 103/63 | HR 83 | Ht 60.0 in | Wt 124.2 lb

## 2018-06-28 DIAGNOSIS — Z3169 Encounter for other general counseling and advice on procreation: Secondary | ICD-10-CM

## 2018-06-28 NOTE — Patient Instructions (Signed)
Female Infertility    Female infertility refers to a woman's inability to get pregnant (conceive) after a year of having sex regularly (or after 6 months in women over age 32) without using birth control. Infertility can also mean that a woman is not able to carry a pregnancy to full term.  Both women and men can have fertility problems.  What are the causes?  This condition may be caused by:  Problems with reproductive organs. Infertility can result if a woman:  Has an abnormally short cervix or a cervix that does not remain closed during a pregnancy.  Has a blockage or scarring in the fallopian tubes.  Has an abnormally shaped uterus.  Has uterine fibroids. This is a benign mass of tissue or muscle (tumor) that can develop in the uterus.  Is not ovulating in a regular way.  Certain medical conditions. These may include:  Polycystic ovary syndrome (PCOS). This is a hormonal disorder that can cause small cysts to grow on the ovaries. This is the most common cause of infertility in women.  Endometriosis. This is a condition in which the tissue that lines the uterus (endometrium) grows outside of its normal location.  Cancer and cancer treatments, such as chemotherapy or radiation.  Premature ovarian failure. This is when ovaries stop producing eggs and hormones before age 40.  Sexually transmitted diseases, such as chlamydia or gonorrhea.  Autoimmune disorders. These are disorders in which the body's defense system (immune system) attacks normal, healthy cells.  Infertility can be linked to more than one cause. For some women, the cause of infertility is not known (unexplained infertility).  What increases the risk?  Age. A woman's fertility declines with age, especially after her mid-30s.  Being underweight or overweight.  Drinking too much alcohol.  Using drugs such as anabolic steroids, cocaine, and marijuana.  Exercising excessively.  Being exposed to environmental toxins, such as radiation, pesticides, and  certain chemicals.  What are the signs or symptoms?  The main sign of infertility in women is the inability to get pregnant or carry a pregnancy to full term.  How is this diagnosed?  This condition may be diagnosed by:  Checking whether you are ovulating each month. The tests may include:  Blood tests to check hormone levels.  An ultrasound of the ovaries.  Taking a small tissue that lines the uterus and checking it under a microscope (endometrial biopsy).  Doing additional tests. This is done if ovulation is normal. Tests may include:  Hysterosalpingography. This X-ray test can show the shape of the uterus and whether the fallopian tubes are open.  Laparoscopy. This test uses a lighted tube (laparoscope) to look for problems in the fallopian tubes and other organs.  Transvaginal ultrasound. This imaging test is used to check for abnormalities in the uterus and ovaries.  Hysteroscopy. This test uses a lighted tube to check for problems in the cervix and the uterus.  To be diagnosed with infertility, both partners will have a physical exam. Both partners will also have an extensive medical and sexual history taken. Additional tests may be done.  How is this treated?  Treatment depends on the cause of infertility. Most cases of infertility in women are treated with medicine or surgery.  Women may take medicine to:  Correct ovulation problems.  Treat other health conditions.  Surgery may be done to:  Repair damage to the ovaries, fallopian tubes, cervix, or uterus.  Remove growths from the uterus.  Remove scar tissue   from the uterus, pelvis, or other organs.  Assisted reproductive technology (ART)  Assisted reproductive technology (ART) refers to all treatments and procedures that combine eggs and sperm outside the body to try to help a couple conceive. ART is often combined with fertility drugs to stimulate ovulation. Sometimes ART is done using eggs retrieved from another woman's body (donor eggs) or from previously  frozen fertilized eggs (embryos).  There are different types of ART. These include:  Intrauterine insemination (IUI). A long, thin tube is used to place sperm directly into a woman's uterus. This procedure:  Is effective for infertility caused by sperm problems, including low sperm count and low motility.  Can be used in combination with fertility drugs.  In vitro fertilization (IVF). This is done when a woman's fallopian tubes are blocked or when a man has low sperm count. In this procedure:  Fertility drugs are used to stimulate the ovaries to produce multiple eggs.  Once mature, these eggs are removed from the body and combined with the sperm to be fertilized.  The fertilized eggs are then placed into the woman's uterus.  Follow these instructions at home:  Take over-the-counter and prescription medicines only as told by your health care provider.  Do not use any products that contain nicotine or tobacco, such as cigarettes and e-cigarettes. If you need help quitting, ask your health care provider.  If you drink alcohol, limit how much you have to 1 drink a day.  Make dietary changes to lose weight or maintain a healthy weight. Work with your health care provider and a dietitian to set a weight-loss goal that is healthy and reasonable for you.  Seek support from a counselor or support group to talk about your concerns related to infertility. Couples counseling may be helpful for you and your partner.  Practice stress reduction techniques that work well for you, such as regular physical activity, meditation, or deep breathing.  Keep all follow-up visits as told by your health care provider. This is important.  Contact a health care provider if you:  Feel that stress is interfering with your life and relationships.  Have side effects from treatments for infertility.  Summary  Female infertility refers to a woman's inability to get pregnant (conceive) after a year of having sex regularly (or after 6 months in women  over age 32) without using birth control.  To be diagnosed with infertility, both partners will have a physical exam. Both partners will also have an extensive medical and sexual history taken.  Seek support from a counselor or support group to talk about your concerns related to infertility. Couples counseling may be helpful for you and your partner.  This information is not intended to replace advice given to you by your health care provider. Make sure you discuss any questions you have with your health care provider.  Document Released: 01/09/2003 Document Revised: 12/08/2016 Document Reviewed: 12/08/2016  Elsevier Interactive Patient Education © 2019 Elsevier Inc.  •

## 2018-06-28 NOTE — Progress Notes (Signed)
Subjective:    Stacie Garrison is a 33 y.o. female who presents for evaluation of infertility. Patient and partner have been attempting conception for 9 months. Marital status: married for 6 years. Pregnancies with current partner: yes.x 1. Her partner has 3 other children with his first wife. He is currently 69 yrs old.   Menstrual and Endocrine History LMP Patient's last menstrual period was 06/07/2018 (exact date).  Shortest interval 27  Longest interval 29  days  Duration of flow 6 days  Heavy menses heavier in the first 3 days of cycle  Clots no  Dysmenorrhea no  Amenorrhea no  Weight change no  Hirsutism no  Balding no  Acne no  Galactorrhea no   Obstetrical History [id number]  Gynecologic History Last PAP 2 yrs ago  Previous abdominal or pelvic surgery no  Pelvic pain no  Endometriosis no  Hot flashes no  DES exposure no  Abnormal Pap no  Cervix Cryo/cone no  Sexually transmitted diseases no  Pelvic inflammatory disease no   Infertility and Endocrine Studies Basal body temperature no  Endo with biopsy no  Hysterosalpingogram no  Post-coital test no  Laparoscopy no  Hormonal studies no  Semen analysis no, information given   Other studies no  Medications none  Other therapies no  Insemination no   Contraception None    Habits Cigarettes:    Wife -  no    Husband - yes, a few a day Alcohol:    Wife -  no    Husband - yes, some  Marijuana:   Wife - no   Husband - no  The following portions of the patient's history were reviewed and updated as appropriate: allergies, current medications, past family history, past medical history, past social history, past surgical history and problem list.  Review of Systems Pertinent items are noted in HPI.   Marital History: married # of years with this partner: 6   Objective:    Female Exam BP 103/63   Pulse 83   Ht 5' (1.524 m)   Wt 124 lb 3 oz (56.3 kg)   LMP 06/07/2018 (Exact Date)   BMI 24.25 kg/m   Wt Readings from Last 1 Encounters:  06/28/18 124 lb 3 oz (56.3 kg)   BMI: Body mass index is 24.25 kg/m. 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   Plan:   Labs today: TSH, prolactin, FSH/LH, testosterone. Labs future:estradiol on day 3 , progesterone day 21, 22, or 23 of cycle.U/S pelvic ordered.  Discussed timing of intercourse and use of cycle app on phone as well as,  use of ovulation kit. Encouraged pt partner to have semen analysis information given. Discussed lab testing and u/s . If all testing unremarkable will discuss and order clomid to induce ovulation.   I attest more than 50% of this visit spent reviewing history, discussing normal cycle and conception, use of app and ovulation kit, lab & u/s work up , plan for clomid to induce ovulation. All questions answered. Face to face time 20 min.   Philip Aspen, CNM

## 2018-06-29 ENCOUNTER — Telehealth: Payer: Self-pay

## 2018-06-29 LAB — PROLACTIN: Prolactin: 9.8 ng/mL (ref 4.8–23.3)

## 2018-06-29 LAB — FSH/LH
FSH: 4.6 m[IU]/mL
LH: 2 m[IU]/mL

## 2018-06-29 LAB — TESTOSTERONE: Testosterone: 14 ng/dL (ref 8–48)

## 2018-06-29 NOTE — Telephone Encounter (Signed)
Patient informed of normal blood results

## 2018-07-05 ENCOUNTER — Telehealth: Payer: Self-pay

## 2018-07-05 NOTE — Telephone Encounter (Signed)
Coronavirus (COVID-19) Are you at risk?  Are you at risk for the Coronavirus (COVID-19)?  To be considered HIGH RISK for Coronavirus (COVID-19), you have to meet the following criteria:  . Traveled to [place], [place], [place], [place] or [place]; or in the [place] to [place], [place], [place], or [place]; and have fever, cough, and shortness of breath within the last 2 weeks of travel OR . Been in close contact with a person diagnosed with COVID-19 within the last 2 weeks and have fever, cough, and shortness of breath . IF YOU DO NOT MEET THESE CRITERIA, YOU ARE CONSIDERED LOW RISK FOR COVID-19.  What to do if you are HIGH RISK for COVID-19?  . If you are having a medical emergency, call 911. . Seek medical care right away. Before you go to a doctor's office, urgent care or emergency department, call ahead and tell them about your recent travel, contact with someone diagnosed with COVID-19, and your symptoms. You should receive instructions from your physician's office regarding next steps of care.  . When you arrive at healthcare provider, tell the healthcare staff immediately you have returned from visiting [place], [place], [place], [place] or [place]; or traveled in the [place] to [place], [place], [place], or [place]; in the last two weeks or you have been in close contact with a person diagnosed with COVID-19 in the last 2 weeks.   . Tell the health care staff about your symptoms: fever, cough and shortness of breath. . After you have been seen by a medical provider, you will be either: o Tested for (COVID-19) and discharged home on quarantine except to seek medical care if symptoms worsen, and asked to  - Stay home and avoid contact with others until you get your results (4-5 days)  - Avoid travel on public transportation if possible (such as bus, train, or airplane) or o Sent to the Emergency Department by EMS for evaluation, COVID-19 testing, and possible  admission depending on your condition and test results.  What to do if you are LOW RISK for COVID-19?  Reduce your risk of any infection by using the same precautions used for avoiding the common cold or flu:  . Wash your hands often with soap and warm water for at least 20 seconds.  If soap and water are not readily available, use an alcohol-based hand sanitizer with at least 60% alcohol.  . If coughing or sneezing, cover your mouth and nose by coughing or sneezing into the elbow areas of your shirt or coat, into a tissue or into your sleeve (not your hands). . Avoid shaking hands with others and consider head nods or verbal greetings only. . Avoid touching your eyes, nose, or mouth with unwashed hands.  . Avoid close contact with people who are sick. . Avoid places or events with large numbers of people in one location, like concerts or sporting events. . Carefully consider travel plans you have or are making. . If you are planning any travel outside or inside the [place], visit the CDC's Travelers' Health webpage for the latest health notices. . If you have some symptoms but not all symptoms, continue to monitor at home and seek medical attention if your symptoms worsen. . If you are having a medical emergency, call 911.   ADDITIONAL HEALTHCARE OPTIONS FOR PATIENTS  [facility] / e-Visit: [email or web address]         [facility]: [phone]  [facility]: [phone]                   [facility]: [phone]   Prescreened. Neg .cm 

## 2018-07-06 ENCOUNTER — Other Ambulatory Visit: Payer: Self-pay

## 2018-07-06 ENCOUNTER — Other Ambulatory Visit: Payer: Self-pay | Admitting: Certified Nurse Midwife

## 2018-07-06 ENCOUNTER — Ambulatory Visit (INDEPENDENT_AMBULATORY_CARE_PROVIDER_SITE_OTHER): Payer: Self-pay

## 2018-07-06 DIAGNOSIS — Z3169 Encounter for other general counseling and advice on procreation: Secondary | ICD-10-CM

## 2018-07-14 ENCOUNTER — Other Ambulatory Visit: Payer: Self-pay | Admitting: Certified Nurse Midwife

## 2018-07-14 ENCOUNTER — Telehealth: Payer: Self-pay

## 2018-07-14 DIAGNOSIS — N979 Female infertility, unspecified: Secondary | ICD-10-CM

## 2018-07-14 NOTE — Telephone Encounter (Signed)
Informed pt of ATs orders. Pt states partner has not had testing. Encouraged to do so.

## 2018-07-14 NOTE — Telephone Encounter (Signed)
-----   Message from Philip Aspen, [place] sent at 07/14/2018 11:00 AM EDT ----- Ivin Booty,   Can you let Delayza know that I got the results of her u/s . 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 (A bicornuate uterus is a type of congenital uterine malformation or mllerian duct anomalies in which the uterus appears to be heart-shaped. Bicornuate uteri have two conjoined cavities whereas a typical uterus has only one cavity.) If you can mail her some information. This can contribute to infertility. I am going to put in a referral to [facility] vascular associate for at [place] (hysterosalpingography). It is still recommended that she have her partner have semen analysis. Please ask her if he has had this done yet. Someone should be calling her for the HSG .   If you have any info you can put in the mail on bicornute uterus and HSG procedure that would be great.   Thanks,  Deneise Lever

## 2019-04-18 ENCOUNTER — Ambulatory Visit: Payer: Self-pay | Attending: Internal Medicine

## 2019-04-18 DIAGNOSIS — Z23 Encounter for immunization: Secondary | ICD-10-CM

## 2019-04-18 NOTE — Progress Notes (Signed)
   Covid-19 Vaccination Clinic  Name:  Stacie Garrison    MRN: [id number] DOB: 04-26-1985  04/18/2019  [name] was observed post Covid-19 immunization for 15 minutes without incident. She was provided with Vaccine Information Sheet and instruction to access the V-Safe system.   [name] was instructed to call 911 with any severe reactions post vaccine: Marland Kitchen Difficulty breathing  . Swelling of face and throat  . A fast heartbeat  . A bad rash all over body  . Dizziness and weakness   Immunizations Administered    Name Date Dose VIS Date Route   Pfizer COVID-19 Vaccine 04/18/2019 12:49 PM 0.3 mL 12/31/2018 Intramuscular   Manufacturer: [employer], [employer]   Lot: [id number]   NDC: [address]-8

## 2019-05-10 ENCOUNTER — Ambulatory Visit: Payer: Self-pay | Attending: Internal Medicine

## 2019-05-10 DIAGNOSIS — Z23 Encounter for immunization: Secondary | ICD-10-CM

## 2019-05-10 NOTE — Progress Notes (Signed)
   Covid-19 Vaccination Clinic  Name:  Stacie Garrison    MRN: [id number] DOB: 05-Feb-1985  05/10/2019  Stacie Garrison was observed post Covid-19 immunization for 15 minutes without incident. She was provided with Vaccine Information Sheet and instruction to access the V-Safe system.   Stacie Garrison was instructed to call 911 with any severe reactions post vaccine: Marland Kitchen Difficulty breathing  . Swelling of face and throat  . A fast heartbeat  . A bad rash all over body  . Dizziness and weakness   Immunizations Administered    Name Date Dose VIS Date Route   Pfizer COVID-19 Vaccine 05/10/2019  9:33 AM 0.3 mL 03/16/2018 Intramuscular   Manufacturer: [employer], [employer]   Lot: [id number]   NDC: [address]-7

## 2019-07-16 ENCOUNTER — Encounter: Payer: Self-pay | Admitting: Intensive Care

## 2019-07-16 ENCOUNTER — Emergency Department
Admission: EM | Admit: 2019-07-16 | Discharge: 2019-07-16 | Disposition: A | Discharge status: Home / Self Care | Payer: 59 | Attending: Emergency Medicine | Admitting: Emergency Medicine

## 2019-07-16 ENCOUNTER — Emergency Department: Payer: 59

## 2019-07-16 ENCOUNTER — Other Ambulatory Visit: Payer: Self-pay

## 2019-07-16 DIAGNOSIS — O418X10 Other specified disorders of amniotic fluid and membranes, first trimester, not applicable or unspecified: Secondary | ICD-10-CM

## 2019-07-16 DIAGNOSIS — N939 Abnormal uterine and vaginal bleeding, unspecified: Secondary | ICD-10-CM

## 2019-07-16 DIAGNOSIS — O209 Hemorrhage in early pregnancy, unspecified: Secondary | ICD-10-CM | POA: Diagnosis present

## 2019-07-16 DIAGNOSIS — Z3A01 Less than 8 weeks gestation of pregnancy: Secondary | ICD-10-CM | POA: Insufficient documentation

## 2019-07-16 DIAGNOSIS — O200 Threatened abortion: Secondary | ICD-10-CM | POA: Diagnosis not present

## 2019-07-16 LAB — COMPREHENSIVE METABOLIC PANEL
ALT: 14 U/L (ref 0–44)
AST: 15 U/L (ref 15–41)
Albumin: 4 g/dL (ref 3.5–5.0)
Alkaline Phosphatase: 45 U/L (ref 38–126)
Anion gap: 8 (ref 5–15)
BUN: 13 mg/dL (ref 6–20)
CO2: 22 mmol/L (ref 22–32)
Calcium: 8.6 mg/dL — ABNORMAL LOW (ref 8.9–10.3)
Chloride: 107 mmol/L (ref 98–111)
Creatinine, Ser: 0.58 mg/dL (ref 0.44–1.00)
GFR calc Af Amer: >60 mL/min (ref >60)
GFR calc non Af Amer: >60 mL/min (ref >60)
Glucose, Bld: 91 mg/dL (ref 70–99)
Potassium: 3.6 mmol/L (ref 3.5–5.1)
Sodium: 137 mmol/L (ref 135–145)
Total Bilirubin: 0.5 mg/dL (ref 0.3–1.2)
Total Protein: 7.6 g/dL (ref 6.5–8.1)

## 2019-07-16 LAB — CBC
HCT: 33.4 % — ABNORMAL LOW (ref 36.0–46.0)
Hemoglobin: 11.6 g/dL — ABNORMAL LOW (ref 12.0–15.0)
MCH: 29.8 pg (ref 26.0–34.0)
MCHC: 34.7 g/dL (ref 30.0–36.0)
MCV: 85.9 fL (ref 80.0–100.0)
Platelets: 233 10*3/uL (ref 150–400)
RBC: 3.89 MIL/uL (ref 3.87–5.11)
RDW: 13.3 % (ref 11.5–15.5)
WBC: 10.7 10*3/uL — ABNORMAL HIGH (ref 4.0–10.5)
nRBC: 0 % (ref 0.0–0.2)

## 2019-07-16 LAB — URINALYSIS, COMPLETE (UACMP) WITH MICROSCOPIC
Bacteria, UA: NONE SEEN
Bilirubin Urine: NEGATIVE
Glucose, UA: NEGATIVE mg/dL
Ketones, ur: NEGATIVE mg/dL
Leukocytes,Ua: NEGATIVE
Nitrite: NEGATIVE
Protein, ur: NEGATIVE mg/dL
Specific Gravity, Urine: 1.023 (ref 1.005–1.030)
pH: 6 (ref 5.0–8.0)

## 2019-07-16 LAB — HCG, QUANTITATIVE, PREGNANCY: hCG, Beta Chain, Quant, S: 148885 m[IU]/mL — ABNORMAL HIGH (ref <5)

## 2019-07-16 LAB — LIPASE, BLOOD: Lipase: 28 U/L (ref 11–51)

## 2019-07-16 LAB — ABO/RH: ABO/RH(D): O POS

## 2019-07-16 NOTE — ED Notes (Signed)
No peripheral IV placed this visit.   Discharge instructions reviewed with patient. Questions fielded by this RN. Patient verbalizes understanding of instructions. Patient discharged home in stable condition per provider . No acute distress noted at time of discharge.   Interpreter, My, [phone], used for DC  Pt ambulatory to lobby

## 2019-07-16 NOTE — ED Notes (Signed)
Patient taken to ultrasound.

## 2019-07-16 NOTE — ED Triage Notes (Signed)
Patient reports getting positive preg test at home and now experiencing vaginal bleeding and some abdominal cramps. Bleeding started this AM and had 1 episode to fill up a pad and now just spotting

## 2019-07-16 NOTE — ED Provider Notes (Signed)
[facility] Emergency Department Provider Note  ____________________________________________   First MD Initiated Contact with Patient 07/16/19 1858     (approximate)  I have reviewed the triage vital signs and the nursing notes.   HISTORY  Chief Complaint Abdominal Pain and Possible Pregnancy    HPI Stacie Garrison is a 34 y.o. female presents emergency department with vaginal bleeding and pregnancy.  States that she had a positive pregnancy test at home and started having cramping and bleeding this morning.  Filled out 1 pad but now is just spotting.  No fever or chills.  Patient does have an appointment with her OB/GYN at [facility] for Tuesday of next week.   Pain scale rated at 1/10   Past Medical History:  Diagnosis Date  . Medical history non-contributory     Patient Active Problem List   Diagnosis Date Noted  . Postpartum care following vaginal delivery 03/02/2016  . Normal labor 02/29/2016  . Indication for care in labor and delivery, antepartum 02/28/2016  . Irregular contractions 02/28/2016    Past Surgical History:  Procedure Laterality Date  . APPENDECTOMY      Prior to Admission medications   Medication Sig Start Date End Date Taking? Authorizing Provider  Prenatal Vit-Fe Fumarate-FA (MULTIVITAMIN-PRENATAL) 27-0.8 MG TABS tablet Take 1 tablet by mouth daily at 12 noon.    [provider]    Allergies Patient has no known allergies.  History reviewed. No pertinent family history.  Social History Social History   Tobacco Use  . Smoking status: Never Smoker  . Smokeless tobacco: Never Used  Substance Use Topics  . Alcohol use: No  . Drug use: No    Review of Systems  Constitutional: No fever/chills Eyes: No visual changes. ENT: No sore throat. Respiratory: Denies cough Gastrointestinal: Denies abdominal pain Genitourinary: Negative for dysuria.  Positive vaginal bleeding Musculoskeletal: Negative for back  pain. Skin: Negative for rash. Psychiatric: no mood changes,     ____________________________________________   PHYSICAL EXAM:  VITAL SIGNS: ED Triage Vitals  Enc Vitals Group     BP 07/16/19 1704 100/68     Pulse Rate 07/16/19 1704 84     Resp 07/16/19 1704 14     Temp 07/16/19 1704 98.7 F (37.1 C)     Temp Source 07/16/19 1704 Oral     SpO2 07/16/19 1704 100 %     Weight 07/16/19 1707 126 lb (57.2 kg)     Height 07/16/19 1707 5\' 4"  (1.626 m)     Head Circumference --      Peak Flow --      Pain Score 07/16/19 1706 1     Pain Loc --      Pain Edu? --      Excl. in GC? --     Constitutional: Alert and oriented. Well appearing and in no acute distress. Eyes: Conjunctivae are normal.  Head: Atraumatic. Nose: No congestion/rhinnorhea. Mouth/Throat: Mucous membranes are moist.   Neck:  supple no lymphadenopathy noted Cardiovascular: Normal rate, regular rhythm.  Respiratory: Normal respiratory effort.  No retractions,  Abd: soft nontender bs normal all 4 quad GU: deferred Musculoskeletal: FROM all extremities, warm and well perfused Neurologic:  Normal speech and language.  Skin:  Skin is warm, dry and intact. No rash noted. Psychiatric: Mood and affect are normal. Speech and behavior are normal.  ____________________________________________   LABS (all labs ordered are listed, but only abnormal results are displayed)  Labs Reviewed  COMPREHENSIVE  METABOLIC PANEL - Abnormal; Notable for the following components:      Result Value   Calcium 8.6 (*)    All other components within normal limits  CBC - Abnormal; Notable for the following components:   WBC 10.7 (*)    Hemoglobin 11.6 (*)    HCT 33.4 (*)    All other components within normal limits  URINALYSIS, COMPLETE (UACMP) WITH MICROSCOPIC - Abnormal; Notable for the following components:   Color, Urine YELLOW (*)    APPearance CLEAR (*)    Hgb urine dipstick LARGE (*)    All other components within normal  limits  HCG, QUANTITATIVE, PREGNANCY - Abnormal; Notable for the following components:   hCG, Beta Chain, Quant, S 148,885 (*)    All other components within normal limits  LIPASE, BLOOD  POC URINE PREG, ED  ABO/RH   ____________________________________________   ____________________________________________  RADIOLOGY  Ultrasound shows a large subchorionic bleed, IUP of 6 weeks  ____________________________________________   PROCEDURES  Procedure(s) performed: No  Procedures    ____________________________________________   INITIAL IMPRESSION / ASSESSMENT AND PLAN / ED COURSE  Pertinent labs & imaging results that were available during my care of the patient were reviewed by me and considered in my medical decision making (see chart for details).   Patient is a 34 year old female presents emergency department with concerns of vaginal bleeding in pregnancy.  See HPI  Physical exam is unremarkable  DDx: Miscarriage, ectopic, subchorionic hemorrhage or hematoma, normal pregnancy  CBC shows a WBC of 10.7, decreased H&H of 11.6 and 33.4, comprehensive metabolic panel is normal, urinalysis is normal except for a large amount of hemoglobin, beta hCG is 148,885, ABO/Rh is O+  Ultrasound OB less than 14 weeks shows a large subchorionic hemorrhage.  Other concerns to me but the radiologist did not comment on was the baby's heart rate was 84.  I did explain everything to the patient.  Told her the fetus is in the correct spot.  She has a large subchorionic hemorrhage.  If she is worsening she needs to return emergency department.  Otherwise she is to follow-up with [facility] for her already scheduled appointment next week.  She states she understands.  She was discharged in stable condition.    Stacie Garrison was evaluated in Emergency Department on 07/16/2019 for the symptoms described in the history of present illness. She was evaluated in the context of the global COVID-19  pandemic, which necessitated consideration that the patient might be at risk for infection with the SARS-CoV-2 virus that causes COVID-19. Institutional protocols and algorithms that pertain to the evaluation of patients at risk for COVID-19 are in a state of rapid change based on information released by regulatory bodies including the CDC and federal and state organizations. These policies and algorithms were followed during the patient's care in the ED.   As part of my medical decision making, I reviewed the following data within the [place]   labs, nurses notes, past medical history, and radiology____________________   FINAL CLINICAL IMPRESSION(S) / ED DIAGNOSES  Final diagnoses:  Vaginal bleeding  Threatened miscarriage  Subchorionic hemorrhage of placenta in first trimester, single or unspecified fetus      NEW MEDICATIONS STARTED DURING THIS VISIT:  New Prescriptions   No medications on file     Note:  This document was prepared using Dragon voice recognition software and may include unintentional dictation errors.    Versie Starks, PA-C 07/16/19 2036  Emily Filbert, MD 07/16/19 2112

## 2019-07-19 ENCOUNTER — Other Ambulatory Visit (HOSPITAL_COMMUNITY)
Admission: RE | Admit: 2019-07-19 | Discharge: 2019-07-19 | Disposition: A | Discharge status: Home / Self Care | Payer: 59 | Source: Ambulatory Visit | Attending: Advanced Practice Midwife | Admitting: Advanced Practice Midwife

## 2019-07-19 ENCOUNTER — Encounter: Payer: Self-pay | Admitting: Advanced Practice Midwife

## 2019-07-19 ENCOUNTER — Ambulatory Visit (INDEPENDENT_AMBULATORY_CARE_PROVIDER_SITE_OTHER): Payer: 59 | Admitting: Advanced Practice Midwife

## 2019-07-19 ENCOUNTER — Other Ambulatory Visit: Payer: Self-pay

## 2019-07-19 VITALS — BP 114/70 | Ht 64.0 in | Wt 123.0 lb

## 2019-07-19 DIAGNOSIS — Z113 Encounter for screening for infections with a predominantly sexual mode of transmission: Secondary | ICD-10-CM | POA: Insufficient documentation

## 2019-07-19 DIAGNOSIS — O209 Hemorrhage in early pregnancy, unspecified: Secondary | ICD-10-CM | POA: Diagnosis not present

## 2019-07-19 DIAGNOSIS — Z124 Encounter for screening for malignant neoplasm of cervix: Secondary | ICD-10-CM | POA: Diagnosis not present

## 2019-07-19 NOTE — Patient Instructions (Signed)

## 2019-07-20 LAB — BETA HCG QUANT (REF LAB): hCG Quant: 131086 m[IU]/mL

## 2019-07-20 NOTE — Progress Notes (Signed)
   Patient ID: Stacie Garrison, female   DOB: 01/16/1986, 34 y.o.   MRN: [id number]  Reason for Consult: Follow-up (ER Follow Up/NOB)    Subjective:  Date of Service: 07/19/2019 Interpreter present  HPI:   Stacie Garrison is a 34 y.o. female is being seen for a follow up visit from the ER. She was seen 3 days ago for vaginal bleeding that filled 1 pad. The bleeding continued into the next day and has since stopped. She denies any bleeding or cramping today. Remarkable ultrasound findings from 3 days ago include: gestational age [redacted]w[redacted]d by CRL, FHR 84 bpm and large sub-chorionic hemorrhage. We discussed a plan today to return to clinic in 2 weeks for a viability scan and NOB visit at that time.   Past Medical History:  Diagnosis Date  . Medical history non-contributory    History reviewed. No pertinent family history. Past Surgical History:  Procedure Laterality Date  . APPENDECTOMY      Short Social History:  Social History   Tobacco Use  . Smoking status: Never Smoker  . Smokeless tobacco: Never Used  Substance Use Topics  . Alcohol use: No    No Known Allergies  Current Outpatient Medications  Medication Sig Dispense Refill  . Prenatal Vit-Fe Fumarate-FA (MULTIVITAMIN-PRENATAL) 27-0.8 MG TABS tablet Take 1 tablet by mouth daily at 12 noon.     No current facility-administered medications for this visit.    Review of Systems  Constitutional: Negative for chills and fever.  HENT: Negative for congestion, ear discharge, ear pain, hearing loss, sinus pain and sore throat.   Eyes: Negative for blurred vision and double vision.  Respiratory: Negative for cough, shortness of breath and wheezing.   Cardiovascular: Negative for chest pain, palpitations and leg swelling.  Gastrointestinal: Negative for abdominal pain, blood in stool, constipation, diarrhea, heartburn, melena, nausea and vomiting.  Genitourinary: Negative for dysuria, flank pain, frequency, hematuria and urgency.    Musculoskeletal: Negative for back pain, joint pain and myalgias.  Skin: Negative for itching and rash.  Neurological: Negative for dizziness, tingling, tremors, sensory change, speech change, focal weakness, seizures, loss of consciousness, weakness and headaches.  Endo/Heme/Allergies: Negative for environmental allergies. Does not bruise/bleed easily.  Psychiatric/Behavioral: Negative for depression, hallucinations, memory loss, substance abuse and suicidal ideas. The patient is not nervous/anxious and does not have insomnia.        Objective:  Objective   Vitals:   07/19/19 1324  BP: 114/70  Weight: 123 lb (55.8 kg)  Height: 5\' 4"  (1.626 m)   Body mass index is 21.11 kg/m. Constitutional: Well nourished, well developed female in no acute distress.  HEENT: normal Skin: Warm and dry.  Cardiovascular: Regular rate and rhythm.   Extremity: no edema  Respiratory: Clear to auscultation bilateral. Normal respiratory effort Neuro: DTRs 2+, Cranial nerves grossly intact Psych: Alert and Oriented x3. No memory deficits. Normal mood and affect.  MS: normal gait, normal bilateral lower extremity ROM/strength/stability.  Pelvic exam: (female chaperone present) is not limited by body habitus EGBUS: within normal limits Vagina: within normal limits and with normal mucosa Cervix: normal appearance, no bleeding    Assessment/Plan:     34 y.o. G2 P13 female with 6 week IUP, ER follow up visit for vaginal bleeding  Return to clinic in 2 weeks for viability scan and NOB visit   P200 CNM [facility] 07/20/2019, 4:58 PM

## 2019-07-21 ENCOUNTER — Other Ambulatory Visit: Payer: Self-pay

## 2019-07-21 ENCOUNTER — Encounter: Payer: Self-pay | Admitting: Emergency Medicine

## 2019-07-21 ENCOUNTER — Emergency Department
Admission: EM | Admit: 2019-07-21 | Discharge: 2019-07-22 | Disposition: A | Discharge status: Home / Self Care | Payer: 59 | Attending: Emergency Medicine | Admitting: Emergency Medicine

## 2019-07-21 DIAGNOSIS — O209 Hemorrhage in early pregnancy, unspecified: Secondary | ICD-10-CM | POA: Diagnosis present

## 2019-07-21 DIAGNOSIS — O200 Threatened abortion: Secondary | ICD-10-CM | POA: Diagnosis not present

## 2019-07-21 DIAGNOSIS — Z3A01 Less than 8 weeks gestation of pregnancy: Secondary | ICD-10-CM | POA: Diagnosis not present

## 2019-07-21 DIAGNOSIS — O4690 Antepartum hemorrhage, unspecified, unspecified trimester: Secondary | ICD-10-CM

## 2019-07-21 DIAGNOSIS — N939 Abnormal uterine and vaginal bleeding, unspecified: Secondary | ICD-10-CM

## 2019-07-21 LAB — CBC
HCT: 34.3 % — ABNORMAL LOW (ref 36.0–46.0)
Hemoglobin: 11.8 g/dL — ABNORMAL LOW (ref 12.0–15.0)
MCH: 29.4 pg (ref 26.0–34.0)
MCHC: 34.4 g/dL (ref 30.0–36.0)
MCV: 85.5 fL (ref 80.0–100.0)
Platelets: 239 10*3/uL (ref 150–400)
RBC: 4.01 MIL/uL (ref 3.87–5.11)
RDW: 12.9 % (ref 11.5–15.5)
WBC: 10.2 10*3/uL (ref 4.0–10.5)
nRBC: 0 % (ref 0.0–0.2)

## 2019-07-21 LAB — URINALYSIS, COMPLETE (UACMP) WITH MICROSCOPIC
Bilirubin Urine: NEGATIVE
Glucose, UA: NEGATIVE mg/dL
Ketones, ur: NEGATIVE mg/dL
Leukocytes,Ua: NEGATIVE
Nitrite: NEGATIVE
Protein, ur: 30 mg/dL — AB
Specific Gravity, Urine: 1.02 (ref 1.005–1.030)
pH: 6 (ref 5.0–8.0)

## 2019-07-21 LAB — COMPREHENSIVE METABOLIC PANEL
ALT: 14 U/L (ref 0–44)
AST: 15 U/L (ref 15–41)
Albumin: 4 g/dL (ref 3.5–5.0)
Alkaline Phosphatase: 38 U/L (ref 38–126)
Anion gap: 8 (ref 5–15)
BUN: 13 mg/dL (ref 6–20)
CO2: 25 mmol/L (ref 22–32)
Calcium: 9.3 mg/dL (ref 8.9–10.3)
Chloride: 102 mmol/L (ref 98–111)
Creatinine, Ser: 0.64 mg/dL (ref 0.44–1.00)
GFR calc Af Amer: >60 mL/min (ref >60)
GFR calc non Af Amer: >60 mL/min (ref >60)
Glucose, Bld: 87 mg/dL (ref 70–99)
Potassium: 3.8 mmol/L (ref 3.5–5.1)
Sodium: 135 mmol/L (ref 135–145)
Total Bilirubin: 0.8 mg/dL (ref 0.3–1.2)
Total Protein: 7.7 g/dL (ref 6.5–8.1)

## 2019-07-21 LAB — HCG, QUANTITATIVE, PREGNANCY: hCG, Beta Chain, Quant, S: 232585 m[IU]/mL — ABNORMAL HIGH (ref <5)

## 2019-07-21 NOTE — ED Triage Notes (Addendum)
Pt here for worsening vag bleeding, was here Saturday for the same and was told she was [redacted] weeks pregnant. US showed large subchorionic hemorrhage other wise normal tests.

## 2019-07-21 NOTE — ED Notes (Signed)
Pt states she is [redacted] weeks pregnant and is coming in for abdominal pain and dark red vaginal bleeding since last Saturday. Pt states this is her 3rd pregnancy with 1 child living and 1 miscarage. Pt states being seen earlier in the week and pt states she was diagnosed with a normal pregnancy.  Translator AMN: Passio [id number]

## 2019-07-22 ENCOUNTER — Emergency Department: Payer: 59

## 2019-07-22 ENCOUNTER — Telehealth: Payer: Self-pay

## 2019-07-22 LAB — CYTOLOGY - PAP
Chlamydia: NEGATIVE
Comment: NEGATIVE
Comment: NEGATIVE
Comment: NEGATIVE
Comment: NORMAL
Diagnosis: NEGATIVE
High risk HPV: NEGATIVE
Neisseria Gonorrhea: NEGATIVE
Trichomonas: NEGATIVE

## 2019-07-22 NOTE — ED Provider Notes (Signed)
[facility] Emergency Department Provider Note   ____________________________________________    I have reviewed the triage vital signs and the nursing notes.   HISTORY  Chief Complaint Vaginal Bleeding     HPI Stacie Garrison is a 33 y.o. female who is a G3, P1 who presents with complaints of worsening vaginal bleeding.  Patient reports and review of medical records confirms she was seen here recently had an ultrasound performed at that time.  In the interim also saw her OB.  Complains of worsening bleeding as well as increasing pelvic cramping.  Denies fevers or chills.  No dysuria.  No nausea or vomiting.  Past Medical History:  Diagnosis Date  . Medical history non-contributory     Patient Active Problem List   Diagnosis Date Noted  . Postpartum care following vaginal delivery 03/02/2016  . Normal labor 02/29/2016  . Indication for care in labor and delivery, antepartum 02/28/2016  . Irregular contractions 02/28/2016    Past Surgical History:  Procedure Laterality Date  . APPENDECTOMY      Prior to Admission medications   Medication Sig Start Date End Date Taking? Authorizing Provider  Prenatal Vit-Fe Fumarate-FA (MULTIVITAMIN-PRENATAL) 27-0.8 MG TABS tablet Take 1 tablet by mouth daily at 12 noon.    [provider]     Allergies Patient has no known allergies.  No family history on file.  Social History Social History   Tobacco Use  . Smoking status: Never Smoker  . Smokeless tobacco: Never Used  Substance Use Topics  . Alcohol use: No  . Drug use: No    Review of Systems  Constitutional: No fever/chills Eyes: No visual changes.  ENT: No sore throat. Cardiovascular: Denies chest pain. Respiratory: No cough Gastrointestinal: As above Genitourinary: As above Musculoskeletal: Negative for back pain. Skin: Negative for rash. Neurological: Negative for  headaches   ____________________________________________   PHYSICAL EXAM:  VITAL SIGNS: ED Triage Vitals [07/21/19 2137]  Enc Vitals Group     BP (!) 91/55     Pulse Rate 81     Resp 20     Temp 99.5 F (37.5 C)     Temp Source Oral     SpO2 100 %     Weight 55.8 kg (123 lb)     Height      Head Circumference      Peak Flow      Pain Score 8     Pain Loc      Pain Edu?      Excl. in GC?     Constitutional: Alert and oriented.   Nose: No congestion/rhinnorhea. Mouth/Throat: Mucous membranes are moist.   Neck:  Painless ROM Cardiovascular: Normal rate, regular rhythm.   Good peripheral circulation. Respiratory: Normal respiratory effort.  No retractions.  Gastrointestinal: Soft and nontender. No distention.   Genitourinary: deferred Musculoskeletal:   Warm and well perfused Neurologic:  Normal speech and language. No gross focal neurologic deficits are appreciated.  Skin:  Skin is warm, dry and intact. No rash noted. Psychiatric: Mood and affect are normal. Speech and behavior are normal.  ____________________________________________   LABS (all labs ordered are listed, but only abnormal results are displayed)  Labs Reviewed  CBC - Abnormal; Notable for the following components:      Result Value   Hemoglobin 11.8 (*)    HCT 34.3 (*)    All other components within normal limits  HCG, QUANTITATIVE, PREGNANCY - Abnormal; Notable for the  following components:   hCG, Beta Chain, Quant, S 232,585 (*)    All other components within normal limits  URINALYSIS, COMPLETE (UACMP) WITH MICROSCOPIC - Abnormal; Notable for the following components:   Color, Urine AMBER (*)    APPearance HAZY (*)    Hgb urine dipstick LARGE (*)    Protein, ur 30 (*)    Bacteria, UA RARE (*)    All other components within normal limits  COMPREHENSIVE METABOLIC PANEL    ____________________________________________  EKG  None ____________________________________________  RADIOLOGY  Ultrasound OB ____________________________________________   PROCEDURES  Procedure(s) performed: No  Procedures   Critical Care performed: No ____________________________________________   INITIAL IMPRESSION / ASSESSMENT AND PLAN / ED COURSE  Pertinent labs & imaging results that were available during my care of the patient were reviewed by me and considered in my medical decision making (see chart for details).  Patient presents with complaints of worsening vaginal bleeding and lower abdominal cramping.  Review of medical records demonstrates the patient had a ultrasound performed several days ago which demonstrated fairly large subchorionic hematoma.  This is likely the cause of her increased bleeding although certainly miscarriage is also on the differential.  Her hCG has increased which is reassuring.  Patient is quite concerned about pregnancy, will reultrasound  Ultrasound does not demonstrate a heart rate concerning for failed pregnancy, discussed this with patient using [place] interpreter, she understands likely progression to miscarriage and the need for close OB follow-up.  Patient is Rh+    ____________________________________________   FINAL CLINICAL IMPRESSION(S) / ED DIAGNOSES  Final diagnoses:  Vaginal bleeding in pregnancy  Threatened miscarriage        Note:  This document was prepared using Dragon voice recognition software and may include unintentional dictation errors.   Jene Every, MD 07/22/19 [phone]

## 2019-07-22 NOTE — Discharge Instructions (Addendum)
We could not see a heart beat on the ultrasound today, this is concerning for a failed pregnancy. As we discussed please follow up with your OB

## 2019-07-22 NOTE — Telephone Encounter (Signed)
Pt called; please call back.  [phone]  With interpreter Lucille Passy' #[id number] pt was told in ED there was no heartbeat.  Has appt scheduled 7/15; needs a sooner appt.

## 2019-07-26 ENCOUNTER — Other Ambulatory Visit: Payer: Self-pay

## 2019-07-26 ENCOUNTER — Ambulatory Visit (INDEPENDENT_AMBULATORY_CARE_PROVIDER_SITE_OTHER): Payer: 59 | Admitting: Obstetrics and Gynecology

## 2019-07-26 ENCOUNTER — Encounter: Payer: Self-pay | Admitting: Obstetrics and Gynecology

## 2019-07-26 VITALS — BP 118/74 | Wt 126.0 lb

## 2019-07-26 DIAGNOSIS — O200 Threatened abortion: Secondary | ICD-10-CM | POA: Diagnosis not present

## 2019-07-26 DIAGNOSIS — Z3A00 Weeks of gestation of pregnancy not specified: Secondary | ICD-10-CM | POA: Diagnosis not present

## 2019-07-26 NOTE — Progress Notes (Signed)
Obstetrics & Gynecology Office Visit   Chief Complaint  Patient presents with  . Follow-up    ER Follow up    History of Present Illness: 34 y.o. G63P1011 female who presents in follow up from the ER for vaginal bleeding in pregnancy.  She had an ultrasound in the ER on 7/2.  The CRL was 5.8 mm.  She had a large subchorionic hemorrhage that measured 4.4 x 3.3 x 6 cm which is an increase in size from 2.2 x 1.4 x 2.2 cm.   She has had no bleeding since that time. She denies pain and cramping.   This is her third pregnancy. G1 was an SAB early, G2 resulted in an SVD in 2018.  G3 is current.  Her blood type is O+.   Past Medical History:  Diagnosis Date  . Medical history non-contributory     Past Surgical History:  Procedure Laterality Date  . APPENDECTOMY      Gynecologic History: Patient's last menstrual period was 06/01/2019 (exact date).  Obstetric History: G3P1001  No family history on file.  Social History   Socioeconomic History  . Marital status: Married    Spouse name: Not on file  . Number of children: Not on file  . Years of education: Not on file  . Highest education level: Not on file  Occupational History  . Not on file  Tobacco Use  . Smoking status: Never Smoker  . Smokeless tobacco: Never Used  Substance and Sexual Activity  . Alcohol use: No  . Drug use: No  . Sexual activity: Yes    Birth control/protection: None  Other Topics Concern  . Not on file  Social History Narrative  . Not on file   Social Determinants of Health   Financial Resource Strain:   . Difficulty of Paying Living Expenses:   Food Insecurity:   . Worried About Programme researcher, broadcasting/film/video in the Last Year:   . Barista in the Last Year:   Transportation Needs:   . Freight forwarder (Medical):   Marland Kitchen Lack of Transportation (Non-Medical):   Physical Activity:   . Days of Exercise per Week:   . Minutes of Exercise per Session:   Stress:   . Feeling of Stress :   Social  Connections:   . Frequency of Communication with Friends and Family:   . Frequency of Social Gatherings with Friends and Family:   . Attends Religious Services:   . Active Member of Clubs or Organizations:   . Attends Banker Meetings:   Marland Kitchen Marital Status:   Intimate Partner Violence:   . Fear of Current or Ex-Partner:   . Emotionally Abused:   Marland Kitchen Physically Abused:   . Sexually Abused:     No Known Allergies  Prior to Admission medications   Medication Sig Start Date End Date Taking? Authorizing Provider  Prenatal Vit-Fe Fumarate-FA (MULTIVITAMIN-PRENATAL) 27-0.8 MG TABS tablet Take 1 tablet by mouth daily at 12 noon.    [provider]    Review of Systems  Constitutional: Negative.   HENT: Negative.   Eyes: Negative.   Respiratory: Negative.   Cardiovascular: Negative.   Gastrointestinal: Negative.   Genitourinary: Negative.   Musculoskeletal: Negative.   Skin: Negative.   Neurological: Negative.   Psychiatric/Behavioral: Negative.      Physical Exam BP 118/74   Wt 126 lb (57.2 kg)   LMP 06/01/2019 (Exact Date)   BMI 21.63 kg/m  Patient's last  menstrual period was 06/01/2019 (exact date). Physical Exam Constitutional:      General: She is not in acute distress.    Appearance: Normal appearance.  HENT:     Head: Normocephalic and atraumatic.  Eyes:     General: No scleral icterus.    Conjunctiva/sclera: Conjunctivae normal.  Neurological:     General: No focal deficit present.     Mental Status: She is alert and oriented to person, place, and time.     Cranial Nerves: No cranial nerve deficit.  Psychiatric:        Mood and Affect: Mood normal.        Behavior: Behavior normal.        Judgment: Judgment normal.     Female chaperone present for pelvic and breast  portions of the physical exam  Assessment: 34 y.o. G34P1001 female here for  1. Threatened abortion      Plan: Problem List Items Addressed This Visit    None    Visit  Diagnoses    Threatened abortion    -  Primary   Relevant Orders   US OB Comp Less 14 Wks     Condolences were offered to the patient and her family.  I stressed that while emotionally difficult, that this did not occur because of an actions or inactions by the patient.  Somewhere between 10-20% of identified first trimester pregnancies will unfortunately end in miscarriage.  Given this relatively high incidence rate, further diagnostic testing such as chromosome analysis is generally not clinically relevant nor recommended.  Although the chromosomal abnormalities have been implicated at rates as high as 70% in some studies, these are generally random and do not infer and increased risk of recurrence with subsequent pregnancies.  However, 3 or more consecutive first trimester losses are relatively uncommon, and these patient generally do benefit from additional work up to determine a potential modifiable etiology.   We briefly discussed management options including expectant management, medical management, and surgical management as well as their relative success rates and complications. Approximately 80% of first trimester miscarriages will pass successfully but may require a time frame of up to 8 weeks (ACOG Practice Bulletin 150 May 2015 "Early Pregnancy Loss").  Medical management using of misoprostil administered every 3-hrs as needed for up to 3 doses speeds up the time frame to completion significantly, has literature supporting its use up to 63 days or [redacted]w[redacted]d gestation and results in a passage rate of 84-85% (ACOG Practice Bulletin 143 March 2014 "Medical Management of First-Trimester Abortion").  Dilation and curettage has the highest rate of uterine evacuation, but carries with is operative cost, surgical and anesthetic risk.  While these risk are relatively small they nevertheless include infection, bleeding, uterine perforation, formation of uterine synechia, and in rare cases death.   We  discussed repeat ultrasound and or trending HCG levels if the patient wishes to pursue these prior to making her decision.  Clinically I am confident of the diagnosis, but I do not want any doubts in the patient's mind regarding the plan of management she chooses to adopt.  I will allow the patient and her family to discuss management options and she was advised to contact the office to arrange final disposition one she has made her decision or should she have any follow up questions for myself.    The patient is Rh positive and rhogam is therefore not indicated.   Will repeat the ultrasound in 1 week to verify SAB.  [place] interpreter present, Lottie Mussel, hospital vietnamese interpreter.   A total of 20 minutes were spent face-to-face with the patient as well as preparation, review, communication, and documentation during this encounter.    Thomasene Mohair, MD 07/26/2019 8:54 AM

## 2019-08-04 ENCOUNTER — Encounter: Payer: 59 | Admitting: Obstetrics

## 2019-08-04 ENCOUNTER — Ambulatory Visit: Payer: 59

## 2019-08-05 ENCOUNTER — Other Ambulatory Visit: Payer: Self-pay | Admitting: Obstetrics and Gynecology

## 2019-08-05 ENCOUNTER — Ambulatory Visit (INDEPENDENT_AMBULATORY_CARE_PROVIDER_SITE_OTHER): Payer: 59

## 2019-08-05 ENCOUNTER — Encounter: Payer: Self-pay | Admitting: Obstetrics and Gynecology

## 2019-08-05 ENCOUNTER — Ambulatory Visit (INDEPENDENT_AMBULATORY_CARE_PROVIDER_SITE_OTHER): Payer: 59 | Admitting: Obstetrics and Gynecology

## 2019-08-05 ENCOUNTER — Other Ambulatory Visit: Payer: Self-pay

## 2019-08-05 VITALS — BP 118/74 | Ht 67.0 in

## 2019-08-05 DIAGNOSIS — O200 Threatened abortion: Secondary | ICD-10-CM | POA: Diagnosis not present

## 2019-08-05 DIAGNOSIS — Z3A01 Less than 8 weeks gestation of pregnancy: Secondary | ICD-10-CM

## 2019-08-05 MED ORDER — MISOPROSTOL 200 MCG PO TABS: 800.0000 ug | ORAL_TABLET | Freq: Once | ORAL | 0 refills | Status: DC

## 2019-08-05 MED ORDER — ONDANSETRON 4 MG PO TBDP: 4.0000 mg | ORAL_TABLET | Freq: Four times a day (QID) | ORAL | 0 refills | Status: DC | PRN

## 2019-08-05 MED ORDER — TRAMADOL HCL 50 MG PO TABS: 50.0000 mg | ORAL_TABLET | Freq: Four times a day (QID) | ORAL | 0 refills | Status: DC | PRN

## 2019-08-05 NOTE — Progress Notes (Signed)
Gynecology Ultrasound Follow Up   Chief Complaint  Patient presents with  . Follow-up  Ultrasound for threatened abortion   History of Present Illness: Patient is a 34 y.o. female who presents today for ultrasound evaluation of the above .  Ultrasound demonstrates the following findings Single intrauterine pregnancy with no cardiac activity. CRL is now less than previous. [facility] still present.  She has had no symptoms since her last visit with me. She denies fevers, chills, and abdominal pain. She also denies vaginal bleeding.  Past Medical History:  Diagnosis Date  . Medical history non-contributory     Past Surgical History:  Procedure Laterality Date  . APPENDECTOMY      History reviewed. No pertinent family history.  Social History   Socioeconomic History  . Marital status: Married    Spouse name: Not on file  . Number of children: Not on file  . Years of education: Not on file  . Highest education level: Not on file  Occupational History  . Not on file  Tobacco Use  . Smoking status: Never Smoker  . Smokeless tobacco: Never Used  Substance and Sexual Activity  . Alcohol use: No  . Drug use: No  . Sexual activity: Yes    Birth control/protection: None  Other Topics Concern  . Not on file  Social History Narrative  . Not on file   Social Determinants of Health   Financial Resource Strain:   . Difficulty of Paying Living Expenses:   Food Insecurity:   . Worried About Programme researcher, broadcasting/film/video in the Last Year:   . Barista in the Last Year:   Transportation Needs:   . Freight forwarder (Medical):   Marland Kitchen Lack of Transportation (Non-Medical):   Physical Activity:   . Days of Exercise per Week:   . Minutes of Exercise per Session:   Stress:   . Feeling of Stress :   Social Connections:   . Frequency of Communication with Friends and Family:   . Frequency of Social Gatherings with Friends and Family:   . Attends Religious Services:   . Active  Member of Clubs or Organizations:   . Attends Banker Meetings:   Marland Kitchen Marital Status:   Intimate Partner Violence:   . Fear of Current or Ex-Partner:   . Emotionally Abused:   Marland Kitchen Physically Abused:   . Sexually Abused:     No Known Allergies  Prior to Admission medications   Medication Sig Start Date End Date Taking? Authorizing Provider  Prenatal Vit-Fe Fumarate-FA (MULTIVITAMIN-PRENATAL) 27-0.8 MG TABS tablet Take 1 tablet by mouth daily at 12 noon.    [provider]    Physical Exam BP 118/74   Ht 5\' 7"  (1.702 m)   LMP 06/01/2019 (Exact Date)   BMI 19.73 kg/m    General: NAD HEENT: normocephalic, anicteric Pulmonary: No increased work of breathing Extremities: no edema, erythema, or tenderness Neurologic: Grossly intact, normal gait Psychiatric: mood appropriate, affect full  Imaging Results 08/01/2019 OB Transvaginal  Result Date: 08/05/2019 Patient Name: Jahari Wiginton DOB: 1985/03/05 MRN: [id number] ULTRASOUND REPORT Location: [facility] Date of Service: 08/05/2019 Indications:Threatened AB Findings: 08/07/2019 intrauterine pregnancy is visualized with a CRL consistent with [redacted]w[redacted]d gestation, giving an (U/S) EDD of 03/28/2020. The (U/S) EDD is not consistent with the clinically established EDD of 03/07/2020. There is no fetal heartbeat seen today. CRL measurement: 5.3 mm (5.8 mm on last ultrasound on 7/6) Yolk sac is visualized  and appears abnormal, collapsed. Amnion: visualized and appears normal There is a subchorionic bleed measuring 45.8 x 20.2 x 48.6 mm. Right Ovary is normal in appearance. Left Ovary is normal appearance. Corpus luteal cyst:  Right ovary Survey of the adnexa demonstrates no adnexal masses. There is no free peritoneal fluid in the cul de sac. Impression: 1. [redacted]w[redacted]d Non Viable Singleton Intrauterine pregnancy by U/S* 2. (U/S) EDD is not consistent with Clinically established EDD of 03/07/2020. 3. There is a 4.9 cm subchorionic bleed. Deanna Artis,  RT *Criteria from the Society of Radiologists in Ultrasound Multispecialty Consensus Conference on Early First Trimester Diagnosis of Miscarriage and Exclusion of a Viable Intrauterine Pregnancy, October 2012. The ultrasound images and findings were reviewed by me and I agree with the above report. Thomasene Mohair, MD, [facility], [facility] 08/05/2019 11:47 AM       Assessment: 34 y.o. G3P1001  1. Threatened abortion      Plan: Problem List Items Addressed This Visit    None    Visit Diagnoses    Threatened abortion    -  Primary   Relevant Medications   misoprostol (CYTOTEC) 200 MCG tablet   ondansetron (ZOFRAN ODT) 4 MG disintegrating tablet   traMADol (ULTRAM) 50 MG tablet   Other Relevant Orders   US PELVIS TRANSVAGINAL NON-OB (TV ONLY)     Patient opts for medical management. Reviewed chance of success and directions for use. I do not have a vietnamese set of instructions and explained this to her.  She voiced understanding.  Will have her follow up in a couple of weeks with an ultrasound to verify passage of products of conception.  A total of 20 minutes were spent face-to-face with the patient as well as preparation, review, communication, and documentation during this encounter.    A hospital provided [place] interpreter was present throughout the appointment.  Thomasene Mohair, MD, [facility], [facility] 08/05/2019 1:16 PM

## 2019-08-24 ENCOUNTER — Ambulatory Visit (INDEPENDENT_AMBULATORY_CARE_PROVIDER_SITE_OTHER): Payer: 59 | Admitting: Obstetrics and Gynecology

## 2019-08-24 ENCOUNTER — Encounter: Payer: Self-pay | Admitting: Obstetrics and Gynecology

## 2019-08-24 ENCOUNTER — Other Ambulatory Visit: Payer: Self-pay

## 2019-08-24 ENCOUNTER — Ambulatory Visit (INDEPENDENT_AMBULATORY_CARE_PROVIDER_SITE_OTHER): Payer: 59

## 2019-08-24 VITALS — BP 118/74 | Wt 123.0 lb

## 2019-08-24 DIAGNOSIS — O200 Threatened abortion: Secondary | ICD-10-CM

## 2019-08-24 DIAGNOSIS — O021 Missed abortion: Secondary | ICD-10-CM

## 2019-08-24 DIAGNOSIS — O039 Complete or unspecified spontaneous abortion without complication: Secondary | ICD-10-CM

## 2019-08-24 NOTE — Progress Notes (Signed)
Gynecology Ultrasound Follow Up   Chief Complaint  Patient presents with  . Follow-up  U/S to verify completion of missed spontaneous abortion   History of Present Illness: Patient is a 34 y.o. female who presents today for ultrasound evaluation of the above.  Ultrasound demonstrates the following findings Adnexa: no masses seen  Uterus: retroverted with endometrial stripe  8.6 mm Additional: increased vascularity with no corresponding areas of increased echogenicity.  Consider retained products of conception, if clinically indicated.   She has had some on and off bleeding for the past 2 weeks since she took the medication. No heavy bleeding. Mostly dark red/brown.  She denies fevers, chills, and abdominal pain.   Past Medical History:  Diagnosis Date  . Medical history non-contributory     Past Surgical History:  Procedure Laterality Date  . APPENDECTOMY      No family history on file.  Social History   Socioeconomic History  . Marital status: Married    Spouse name: Not on file  . Number of children: Not on file  . Years of education: Not on file  . Highest education level: Not on file  Occupational History  . Not on file  Tobacco Use  . Smoking status: Never Smoker  . Smokeless tobacco: Never Used  Substance and Sexual Activity  . Alcohol use: No  . Drug use: No  . Sexual activity: Yes    Birth control/protection: None  Other Topics Concern  . Not on file  Social History Narrative  . Not on file   Social Determinants of Health   Financial Resource Strain:   . Difficulty of Paying Living Expenses:   Food Insecurity:   . Worried About Programme researcher, broadcasting/film/video in the Last Year:   . Barista in the Last Year:   Transportation Needs:   . Freight forwarder (Medical):   Marland Kitchen Lack of Transportation (Non-Medical):   Physical Activity:   . Days of Exercise per Week:   . Minutes of Exercise per Session:   Stress:   . Feeling of Stress :   Social  Connections:   . Frequency of Communication with Friends and Family:   . Frequency of Social Gatherings with Friends and Family:   . Attends Religious Services:   . Active Member of Clubs or Organizations:   . Attends Banker Meetings:   Marland Kitchen Marital Status:   Intimate Partner Violence:   . Fear of Current or Ex-Partner:   . Emotionally Abused:   Marland Kitchen Physically Abused:   . Sexually Abused:     No Known Allergies  Prior to Admission medications   Medication Sig Start Date End Date Taking? Authorizing Provider  misoprostol (CYTOTEC) 200 MCG tablet Place 4 tablets (800 mcg total) vaginally once for 1 dose. May repeat dose between 3 and 72 hours later. 08/05/19 08/05/19  Conard Novak, MD  ondansetron (ZOFRAN ODT) 4 MG disintegrating tablet Take 1 tablet (4 mg total) by mouth every 6 (six) hours as needed for nausea. 08/05/19   Conard Novak, MD  Prenatal Vit-Fe Fumarate-FA (MULTIVITAMIN-PRENATAL) 27-0.8 MG TABS tablet Take 1 tablet by mouth daily at 12 noon.    [provider]  traMADol (ULTRAM) 50 MG tablet Take 1 tablet (50 mg total) by mouth every 6 (six) hours as needed. 08/05/19   Conard Novak, MD    Physical Exam BP 118/74   Wt 123 lb (55.8 kg)   LMP 06/01/2019 (Exact  Date)   BMI 19.26 kg/m    General: NAD HEENT: normocephalic, anicteric Pulmonary: No increased work of breathing Abdominal: s/nt/nd, no suprapubic tenderness to deep palpation.  Extremities: no edema, erythema, or tenderness Neurologic: Grossly intact, normal gait Psychiatric: mood appropriate, affect full  Imaging Results US PELVIS TRANSVAGINAL NON-OB (TV ONLY)  Result Date: 08/24/2019 Patient Name: Stacie Garrison DOB: [birth year] MRN: [id number] ULTRASOUND REPORT Location: [facility] Date of Service: 08/24/2019 Indications: assess for retained products of conception Findings: The uterus is retroverted and measures 8.5 x 7.7 x 5.9 cm. Echo texture is homogenous without evidence of  focal masses. The Endometrium measures 8.6 mm. There are no definitive areas of increased echotexture. Right Ovary measures 3.0 x 1.7 x 1.9 cm. It is normal in appearance. Left Ovary measures 4.3 x 2.8 x 1.1 cm. It is normal in appearance. Survey of the adnexa demonstrates no adnexal masses. There is no free fluid in the cul de sac. Impression: 1. No gestational sac is seen. 2. There are a couple of areas of increased blood flow seen not.  This may be suggestive of retained products of conception.  These areas measure approximately  11.7 x 13.8 x 19.7 mm and 23.0 x 9.1 x 19.7 mm. Correlate clinically. Deanna Artis, RT The ultrasound images and findings were reviewed by me and I agree with the above report. Thomasene Mohair, MD, [facility], [facility] 08/24/2019 10:23 AM       Assessment: 34 y.o. G3P1001  1. Spontaneous abortion      Plan: Problem List Items Addressed This Visit    None    Visit Diagnoses    Spontaneous abortion    -  Primary     She appears to have passed her pregnancy.  There are some suggestions on ultrasound there may still be some products of conception, though there were no directly visible products of conception within the endometrial cavity.  Discussed this finding with her in detail.  She was instructed to let me know if she is still bleeding in 2 weeks or if she develops fever which is a temperature of greater than 100.4 F, and if she develops abdominal pain in her lower abdomen that can feel like soreness, which does not go away.  She voiced understanding and agreement.  The entirety of this visit was conducted with the help of a hospital provided interpreter in [place]  A total of 20 minutes were spent face-to-face with the patient as well as preparation, review, communication, and documentation during this encounter.   Thomasene Mohair, MD, [facility], [facility] 08/24/2019 10:40 AM

## 2019-10-07 ENCOUNTER — Ambulatory Visit: Payer: 59 | Admitting: Obstetrics and Gynecology

## 2019-10-27 ENCOUNTER — Telehealth: Payer: Self-pay | Admitting: Obstetrics and Gynecology

## 2019-10-27 NOTE — Telephone Encounter (Signed)
Pt states her belly really hurts; adv if in that much pain to go to the ED; if saturating a pad q72min-1hr to go to ED.  Pt voiced understanding.

## 2019-10-27 NOTE — Telephone Encounter (Signed)
Patient following up, states she left a message yesterday, having heavy bleeding/pain, appt not until 10/15 with SDJ, nothing available sooner. Please advise patient.

## 2019-11-04 ENCOUNTER — Encounter: Payer: Self-pay | Admitting: Obstetrics and Gynecology

## 2019-11-04 ENCOUNTER — Other Ambulatory Visit: Payer: Self-pay

## 2019-11-04 ENCOUNTER — Ambulatory Visit (INDEPENDENT_AMBULATORY_CARE_PROVIDER_SITE_OTHER): Payer: 59 | Admitting: Obstetrics and Gynecology

## 2019-11-04 VITALS — BP 118/74 | Wt 125.0 lb

## 2019-11-04 DIAGNOSIS — N921 Excessive and frequent menstruation with irregular cycle: Secondary | ICD-10-CM

## 2019-11-04 MED ORDER — NORGESTIMATE-ETH ESTRADIOL 0.25-35 MG-MCG PO TABS: 1.0000 | ORAL_TABLET | Freq: Every day | ORAL | 0 refills | Status: DC

## 2019-11-04 NOTE — Progress Notes (Signed)
Obstetrics & Gynecology Office Visit   Chief Complaint  Patient presents with  . Follow-up    vaginal bleeding since miscarriage   History of Present Illness: 34 y.o. G90P1001 female who presents with bleeding since her miscarriage in July.  The bleeding is sometimes heavy and sometimes light.  She has not taken a repeat pregnancy test since July.  She was seen here in August for an ultrasound, which showed an endometrial stirpe of 8.6 mm. There were no definitive areas of increased echotexture.  She denies fevers and chills.  One time she had pressure in her left lower quadrant.    Past Medical History:  Diagnosis Date  . Medical history non-contributory    Past Surgical History:  Procedure Laterality Date  . APPENDECTOMY     Gynecologic History: Patient's last menstrual period was 06/01/2019 (exact date).  Obstetric History: G3P1001  Family History: No family history of gynecologic cancer   Social History   Socioeconomic History  . Marital status: Married    Spouse name: Not on file  . Number of children: Not on file  . Years of education: Not on file  . Highest education level: Not on file  Occupational History  . Not on file  Tobacco Use  . Smoking status: Never Smoker  . Smokeless tobacco: Never Used  Substance and Sexual Activity  . Alcohol use: No  . Drug use: No  . Sexual activity: Yes    Birth control/protection: None  Other Topics Concern  . Not on file  Social History Narrative  . Not on file   Social Determinants of Health   Financial Resource Strain:   . Difficulty of Paying Living Expenses: Not on file  Food Insecurity:   . Worried About Programme researcher, broadcasting/film/video in the Last Year: Not on file  . Ran Out of Food in the Last Year: Not on file  Transportation Needs:   . Lack of Transportation (Medical): Not on file  . Lack of Transportation (Non-Medical): Not on file  Physical Activity:   . Days of Exercise per Week: Not on file  . Minutes of Exercise  per Session: Not on file  Stress:   . Feeling of Stress : Not on file  Social Connections:   . Frequency of Communication with Friends and Family: Not on file  . Frequency of Social Gatherings with Friends and Family: Not on file  . Attends Religious Services: Not on file  . Active Member of Clubs or Organizations: Not on file  . Attends Banker Meetings: Not on file  . Marital Status: Not on file  Intimate Partner Violence:   . Fear of Current or Ex-Partner: Not on file  . Emotionally Abused: Not on file  . Physically Abused: Not on file  . Sexually Abused: Not on file    No Known Allergies  Prior to Admission medications   Medication Sig Start Date End Date Taking? Authorizing Provider  misoprostol (CYTOTEC) 200 MCG tablet Place 4 tablets (800 mcg total) vaginally once for 1 dose. May repeat dose between 3 and 72 hours later. 08/05/19 08/05/19  Conard Novak, MD  ondansetron (ZOFRAN ODT) 4 MG disintegrating tablet Take 1 tablet (4 mg total) by mouth every 6 (six) hours as needed for nausea. 08/05/19   Conard Novak, MD  Prenatal Vit-Fe Fumarate-FA (MULTIVITAMIN-PRENATAL) 27-0.8 MG TABS tablet Take 1 tablet by mouth daily at 12 noon.    [provider]  traMADol (ULTRAM) 50 MG  tablet Take 1 tablet (50 mg total) by mouth every 6 (six) hours as needed. 08/05/19   Conard Novak, MD    Review of Systems  Constitutional: Negative.   HENT: Negative.   Eyes: Negative.   Respiratory: Negative.   Cardiovascular: Negative.   Gastrointestinal: Negative.   Genitourinary: Negative.   Musculoskeletal: Negative.   Skin: Negative.   Neurological: Negative.   Psychiatric/Behavioral: Negative.      Physical Exam BP 118/74   Wt 125 lb (56.7 kg)   LMP 06/01/2019 (Exact Date)   BMI 19.58 kg/m  Patient's last menstrual period was 06/01/2019 (exact date). Physical Exam Constitutional:      General: She is not in acute distress.    Appearance: Normal  appearance. She is well-developed.  Genitourinary:     Pelvic exam was performed with patient in the lithotomy position.     Vulva, inguinal canal, urethra, bladder, vagina, uterus, right adnexa and left adnexa normal.     No posterior fourchette tenderness, injury or lesion present.     Vaginal exam comments: Scant bleeding in vaginal vault.     No cervical friability, lesion, bleeding or polyp.  HENT:     Head: Normocephalic and atraumatic.  Eyes:     General: No scleral icterus.    Conjunctiva/sclera: Conjunctivae normal.  Cardiovascular:     Rate and Rhythm: Normal rate and regular rhythm.     Heart sounds: No murmur heard.  No friction rub. No gallop.   Pulmonary:     Effort: Pulmonary effort is normal. No respiratory distress.     Breath sounds: Normal breath sounds. No wheezing or rales.  Abdominal:     General: Bowel sounds are normal. There is no distension.     Palpations: Abdomen is soft. There is no mass.     Tenderness: There is no abdominal tenderness. There is no guarding or rebound.  Musculoskeletal:        General: Normal range of motion.     Cervical back: Normal range of motion and neck supple.  Neurological:     General: No focal deficit present.     Mental Status: She is alert and oriented to person, place, and time.     Cranial Nerves: No cranial nerve deficit.  Skin:    General: Skin is warm and dry.     Findings: No erythema.  Psychiatric:        Mood and Affect: Mood normal.        Behavior: Behavior normal.        Judgment: Judgment normal.     Female chaperone present for pelvic and breast  portions of the physical exam  Urine pregnancy test: negative  Assessment: 34 y.o. G74P1001 female here for  1. Menorrhagia with irregular cycle      Plan: Problem List Items Addressed This Visit    None    Visit Diagnoses    Menorrhagia with irregular cycle    -  Primary   Relevant Medications   norgestimate-ethinyl estradiol (SPRINTEC 28) 0.25-35  MG-MCG tablet     Abnormal bleeding of unclear etiology. Negative pregnancy test today.  Will try to cycle with combined OCPs for a month or two. She desires to conceive.  No evidence of infection today.  Visit accomplished with the aid of a hospital provided medical interpreter.   A total of 20 minutes were spent face-to-face with the patient as well as preparation, review, communication, and documentation during this encounter.  Thomasene Mohair, MD 11/04/2019 8:11 AM

## 2019-11-04 NOTE — Addendum Note (Signed)
Addended by: Thomasene Mohair D on: 11/04/2019 06:02 PM   Modules accepted: Orders

## 2019-12-28 ENCOUNTER — Other Ambulatory Visit: Payer: Self-pay | Admitting: Obstetrics and Gynecology

## 2019-12-28 DIAGNOSIS — N921 Excessive and frequent menstruation with irregular cycle: Secondary | ICD-10-CM

## 2019-12-28 NOTE — Telephone Encounter (Signed)
Want me to refill this? Your last note says try for a month or two so I just wanted to make sure you didn't want to see her for a f/u before refilling

## 2020-01-21 NOTE — L&D Delivery Note (Signed)
Delivery Note Primary OB: [facility] Delivery Physician: Annamarie Major, MD Gestational Age: Full term Antepartum complications: none Intrapartum complications: PROM  A viable Female was delivered via vertex presentation. "[name]" Apgars:7 ,9  Weight:  pending .   Placenta status: spontaneous and Intact.  Cord: 3+ vessels;  with the following complications: nuchal.  Anesthesia:  none Episiotomy:  none Lacerations:  2nd Suture Repair: 2.0 Est. Blood Loss (mL):   500 mL  Mom to postpartum.  Baby to Couplet care / Skin to Skin.  Annamarie Major, MD, [facility], [facility] 09/01/2020  1:23 AM [phone]

## 2020-01-30 ENCOUNTER — Other Ambulatory Visit: Payer: Self-pay

## 2020-01-30 ENCOUNTER — Encounter: Payer: Self-pay | Admitting: Obstetrics and Gynecology

## 2020-01-30 ENCOUNTER — Ambulatory Visit (INDEPENDENT_AMBULATORY_CARE_PROVIDER_SITE_OTHER): Payer: Medicaid Other | Admitting: Obstetrics and Gynecology

## 2020-01-30 VITALS — BP 122/70 | Ht 65.0 in | Wt 130.0 lb

## 2020-01-30 DIAGNOSIS — Z3201 Encounter for pregnancy test, result positive: Secondary | ICD-10-CM | POA: Diagnosis not present

## 2020-01-30 DIAGNOSIS — Z8759 Personal history of other complications of pregnancy, childbirth and the puerperium: Secondary | ICD-10-CM | POA: Diagnosis not present

## 2020-01-30 MED ORDER — PRENATAL 27-0.8 MG PO TABS: 1.0000 | ORAL_TABLET | Freq: Every day | ORAL | 3 refills | Status: DC

## 2020-01-30 NOTE — Progress Notes (Signed)
Obstetrics & Gynecology Office Visit   Chief Complaint:  Chief Complaint  Patient presents with  . Pelvic Pain    Pelvic pain with posiitve UPT   History of Present Illness: 35 y.o. G3P1011 who presents for a positive home pregnancy test.  She had a miscarriage several months ago and wants to make sure everything is OK.  Her LMP was 12/05/2019. This gives her a gestational age of [redacted]w[redacted]d. She has had no problems since her last period.  She has had no bleeding.  Last week she states that she worked too much and felt some abdominal pain. She denies nausea.  She does have breast tenderness.      Past Medical History:  Diagnosis Date  . Medical history non-contributory     Past Surgical History:  Procedure Laterality Date  . APPENDECTOMY      Gynecologic History: Patient's last menstrual period was 12/12/2019.  Obstetric History: G3P1011   Family History  Problem Relation Age of Onset  . Hypertension Maternal Grandmother   . Heart Problems Maternal Grandmother   . Hypertension Mother     Social History   Socioeconomic History  . Marital status: Married    Spouse name: Not on file  . Number of children: Not on file  . Years of education: Not on file  . Highest education level: Not on file  Occupational History  . Not on file  Tobacco Use  . Smoking status: Never Smoker  . Smokeless tobacco: Never Used  Substance and Sexual Activity  . Alcohol use: No  . Drug use: No  . Sexual activity: Yes    Birth control/protection: None  Other Topics Concern  . Not on file  Social History Narrative  . Not on file   Social Determinants of Health   Financial Resource Strain: Not on file  Food Insecurity: Not on file  Transportation Needs: Not on file  Physical Activity: Not on file  Stress: Not on file  Social Connections: Not on file  Intimate Partner Violence: Not on file    No Known Allergies  Prior to Admission medications: Denies    Review of Systems   Constitutional: Negative.   HENT: Negative.   Eyes: Negative.   Respiratory: Negative.   Cardiovascular: Negative.   Gastrointestinal: Negative.   Genitourinary: Negative.   Musculoskeletal: Negative.   Skin: Negative.   Neurological: Negative.   Psychiatric/Behavioral: Negative.      Physical Exam BP 122/70   Ht 5\' 5"  (1.651 m)   Wt 130 lb (59 kg)   LMP 12/12/2019   BMI 21.63 kg/m  Patient's last menstrual period was 12/12/2019. Physical Exam Constitutional:      General: She is not in acute distress.    Appearance: Normal appearance.  HENT:     Head: Normocephalic and atraumatic.  Eyes:     General: No scleral icterus.    Conjunctiva/sclera: Conjunctivae normal.  Abdominal:     General: There is no distension.     Palpations: Abdomen is soft. There is no mass.     Tenderness: There is no abdominal tenderness. There is no guarding or rebound.  Neurological:     General: No focal deficit present.     Mental Status: She is alert and oriented to person, place, and time.     Cranial Nerves: No cranial nerve deficit.  Psychiatric:        Mood and Affect: Mood normal.        Behavior: Behavior normal.  Judgment: Judgment normal.    Female chaperone present for pelvic and breast  portions of the physical exam  BSUS: single, living IUP with +cardiac activity (157 bpm), estimated gestational age by rough CRL is [redacted]w[redacted]d.   Assessment: 35 y.o. G41P1001 female here for  1. Positive pregnancy test   2. History of miscarriage      Plan: Problem List Items Addressed This Visit   None   Visit Diagnoses    Positive pregnancy test    -  Primary   Relevant Medications   Prenatal Vit-Fe Fumarate-FA (MULTIVITAMIN-PRENATAL) 27-0.8 MG TABS tablet   Other Relevant Orders   US OB Comp Less 14 Wks   History of miscarriage       Relevant Orders   US OB Comp Less 14 Wks     Verified normal pregnancy today. Will get into established prenatal care.  Hospital-provided  [place] interperter present throughout appointment today.   A total of 20 minutes were spent face-to-face with the patient as well as preparation, review, communication, and documentation during this encounter.    Thomasene Mohair, MD 01/30/2020 4:03 PM

## 2020-02-01 ENCOUNTER — Encounter: Payer: Self-pay | Admitting: *Deleted

## 2020-02-01 ENCOUNTER — Other Ambulatory Visit: Payer: Self-pay

## 2020-02-01 ENCOUNTER — Emergency Department: Payer: BLUE CROSS/BLUE SHIELD

## 2020-02-01 ENCOUNTER — Emergency Department
Admission: EM | Admit: 2020-02-01 | Discharge: 2020-02-02 | Disposition: A | Discharge status: Home / Self Care | Payer: BLUE CROSS/BLUE SHIELD | Attending: Emergency Medicine | Admitting: Emergency Medicine

## 2020-02-01 DIAGNOSIS — U071 COVID-19: Secondary | ICD-10-CM | POA: Insufficient documentation

## 2020-02-01 DIAGNOSIS — O418X10 Other specified disorders of amniotic fluid and membranes, first trimester, not applicable or unspecified: Secondary | ICD-10-CM

## 2020-02-01 DIAGNOSIS — O468X1 Other antepartum hemorrhage, first trimester: Secondary | ICD-10-CM

## 2020-02-01 DIAGNOSIS — O208 Other hemorrhage in early pregnancy: Secondary | ICD-10-CM | POA: Diagnosis not present

## 2020-02-01 DIAGNOSIS — O219 Vomiting of pregnancy, unspecified: Secondary | ICD-10-CM | POA: Insufficient documentation

## 2020-02-01 DIAGNOSIS — Z3A01 Less than 8 weeks gestation of pregnancy: Secondary | ICD-10-CM | POA: Diagnosis not present

## 2020-02-01 DIAGNOSIS — O98511 Other viral diseases complicating pregnancy, first trimester: Secondary | ICD-10-CM | POA: Insufficient documentation

## 2020-02-01 DIAGNOSIS — R1030 Lower abdominal pain, unspecified: Secondary | ICD-10-CM

## 2020-02-01 LAB — URINALYSIS, COMPLETE (UACMP) WITH MICROSCOPIC
Bilirubin Urine: NEGATIVE
Glucose, UA: >=500 mg/dL — AB
Ketones, ur: 5 mg/dL — AB
Leukocytes,Ua: NEGATIVE
Nitrite: NEGATIVE
Protein, ur: NEGATIVE mg/dL
Specific Gravity, Urine: 1.014 (ref 1.005–1.030)
pH: 5 (ref 5.0–8.0)

## 2020-02-01 LAB — COMPREHENSIVE METABOLIC PANEL
ALT: 13 U/L (ref 0–44)
AST: 18 U/L (ref 15–41)
Albumin: 3.9 g/dL (ref 3.5–5.0)
Alkaline Phosphatase: 40 U/L (ref 38–126)
Anion gap: 9 (ref 5–15)
BUN: 9 mg/dL (ref 6–20)
CO2: 22 mmol/L (ref 22–32)
Calcium: 9.2 mg/dL (ref 8.9–10.3)
Chloride: 101 mmol/L (ref 98–111)
Creatinine, Ser: 0.56 mg/dL (ref 0.44–1.00)
GFR, Estimated: >60 mL/min (ref >60)
Glucose, Bld: 143 mg/dL — ABNORMAL HIGH (ref 70–99)
Potassium: 3 mmol/L — ABNORMAL LOW (ref 3.5–5.1)
Sodium: 132 mmol/L — ABNORMAL LOW (ref 135–145)
Total Bilirubin: 0.5 mg/dL (ref 0.3–1.2)
Total Protein: 7.6 g/dL (ref 6.5–8.1)

## 2020-02-01 LAB — CBC
HCT: 34.9 % — ABNORMAL LOW (ref 36.0–46.0)
Hemoglobin: 11.6 g/dL — ABNORMAL LOW (ref 12.0–15.0)
MCH: 28.4 pg (ref 26.0–34.0)
MCHC: 33.2 g/dL (ref 30.0–36.0)
MCV: 85.3 fL (ref 80.0–100.0)
Platelets: 221 10*3/uL (ref 150–400)
RBC: 4.09 MIL/uL (ref 3.87–5.11)
RDW: 14.4 % (ref 11.5–15.5)
WBC: 8 10*3/uL (ref 4.0–10.5)
nRBC: 0 % (ref 0.0–0.2)

## 2020-02-01 LAB — HCG, QUANTITATIVE, PREGNANCY: hCG, Beta Chain, Quant, S: 96987 m[IU]/mL — ABNORMAL HIGH (ref <5)

## 2020-02-01 LAB — LIPASE, BLOOD: Lipase: 30 U/L (ref 11–51)

## 2020-02-01 NOTE — ED Triage Notes (Signed)
Pt to ED reporting congestion, cough, headache and abd pain all starting today. Pt reporting she is [redacted] weeks pregnant with a miscarriage in [place] 2021 and was concerned for the baby. No vaginal bleeding or discharge.

## 2020-02-01 NOTE — ED Notes (Signed)
Pt noted blood type O+ in results

## 2020-02-02 ENCOUNTER — Ambulatory Visit (INDEPENDENT_AMBULATORY_CARE_PROVIDER_SITE_OTHER): Payer: BLUE CROSS/BLUE SHIELD | Admitting: Obstetrics and Gynecology

## 2020-02-02 ENCOUNTER — Encounter: Payer: Self-pay | Admitting: Emergency Medicine

## 2020-02-02 ENCOUNTER — Encounter: Payer: Self-pay | Admitting: Obstetrics and Gynecology

## 2020-02-02 ENCOUNTER — Telehealth: Payer: Self-pay

## 2020-02-02 DIAGNOSIS — U071 COVID-19: Secondary | ICD-10-CM

## 2020-02-02 DIAGNOSIS — O98511 Other viral diseases complicating pregnancy, first trimester: Secondary | ICD-10-CM

## 2020-02-02 DIAGNOSIS — Z3A01 Less than 8 weeks gestation of pregnancy: Secondary | ICD-10-CM | POA: Diagnosis not present

## 2020-02-02 HISTORY — DX: COVID-19: U07.1

## 2020-02-02 LAB — POC SARS CORONAVIRUS 2 AG -  ED: SARS Coronavirus 2 Ag: POSITIVE — AB

## 2020-02-02 MED ORDER — POTASSIUM CHLORIDE CRYS ER 20 MEQ PO TBCR
40.0000 meq | EXTENDED_RELEASE_TABLET | Freq: Once | ORAL | Status: AC
  Administered 2020-02-02: 40 meq via ORAL
  Filled 2020-02-02: qty 2

## 2020-02-02 MED ORDER — DOXYLAMINE-PYRIDOXINE 10-10 MG PO TBEC: DELAYED_RELEASE_TABLET | ORAL | 1 refills | Status: DC

## 2020-02-02 NOTE — ED Notes (Signed)
Pt agreeable with d/c plan as discussed by provider- this nurse has verbally reinforced d/c instructions and provided pt with written copy - pt acknowledges verbal understanding and denies any additional questions, concerns, needs  

## 2020-02-02 NOTE — Discharge Instructions (Addendum)
Your fetus looks okay on ultrasound, but you have two areas of bleeding within your uterus. This is called a subchorionic hemorrhage.  There is nothing to do right now, and sometimes the condition resolves on its own, but other times it can get worse and cause problems for your pregnancy. We recommend you call [facility] and schedule the next available follow up appointment to discuss the results.   Additionally, you tested positive for COVID-19. You will probably feel bad for a couple of weeks.  Try to isolate yourself from other people, stay hydrated, and take acetaminophen (Tylenol) as needed for pain and fever.  Please continue to drink plenty of fluids and use the medication prescribed as needed for nausea and vomiting during pregnancy.  Return to the Emergency Department with new or worsening symptoms that concern you.  [place] nhi c?a b?n trng ?n trn siu m, nh?ng b?n c hai vng ch?y mu trong t? cung. ?y ???c g?i l xu?t huy?t d??i mng ??m. Khng c g ph?i lm ngay by gi?, v ?i khi tnh tr?ng b?nh s? t? kh?i, nh?ng nh?ng l?n khc, n c th? tr? nn t?i t? h?n v gy ra cc v?n ?? cho thai k? c?a b?n. Chng ti khuyn b?n nn g?i cho [facility] v ln l?ch cu?c h?n khm ti?p theo ?? th?o lu?n v? k?t qu?Aleen Sells ra, b?n c k?t qu? d??ng tnh v?i COVID-19. B?n c th? s? c?m th?y t?i t? trong m?t vi tu?n. C? g?ng cch ly b?n thn v?i nh?ng ng??i khc, u?ng ?? n??c v dng acetaminophen (Tylenol) khi c?n ?? gi?m ?au v h? s?t.  Vui lng ti?p t?c u?ng nhi?u n??c v s? d?ng thu?c ???c k ??n khi c?n thi?t ?? gi?m bu?n nn v nn trong [place] k?. Tr? l?i [place] C?p c?u v?i cc tri?u ch?ng m?i ho?c tr?m tr?ng h?n m b?n lo ng?i.

## 2020-02-02 NOTE — ED Provider Notes (Signed)
[facility] Emergency Department Provider Note  ____________________________________________   Event Date/Time   First MD Initiated Contact with Patient 02/01/20 2304     (approximate)  I have reviewed the triage vital signs and the nursing notes.   HISTORY  Chief Complaint Abdominal Pain and Headache   Patient is a native [place] speaker.   HPI Stacie Garrison is a 35 y.o. female  G2P1 at about 7-[redacted] weeks gestation   who presents for evaluation of abdominal cramping in the setting of about 24 hours of general malaise, mild cough, and nasal congestion.  She says she has received two doses of COVID-19 vaccination.  The symptoms were acute in onset and she is having abdominal cramping that is intermittent and moderate at times, currently absent, but concerning to her because she had a miscarriage last year and this is a desired pregnancy.  She says [facility] 3 days ago and had a normal ultrasound.  She has had no vaginal bleeding.  She denies dysuria.  Nothing particular makes the cramps better or worse but she wanted to get checked out given her miscarriage last year.        Past Medical History:  Diagnosis Date  . COVID-19 02/02/2020   Symptomatic since about 01/29/2020, tested + [facility] on 02/02/2020  . Medical history non-contributory     Patient Active Problem List   Diagnosis Date Noted  . Postpartum care following vaginal delivery 03/02/2016  . Normal labor 02/29/2016  . Indication for care in labor and delivery, antepartum 02/28/2016  . Irregular contractions 02/28/2016    Past Surgical History:  Procedure Laterality Date  . APPENDECTOMY      Prior to Admission medications   Medication Sig Start Date End Date Taking? Authorizing Provider  Doxylamine-Pyridoxine (DICLEGIS) 10-10 MG TBEC Two tablets at bedtime on day 1 and 2; if symptoms persist, take 1 tablet in morning and 2 tablets at bedtime on day 3; if symptoms  persist, may increase to 1 tablet in morning, 1 tablet mid-afternoon, and 2 tablets at bedtime on day 4 for a maximum of 4 tablets per day. Use the minimum dose necessary to control your symptoms. 02/02/20  Yes Loleta RoseForbach, Americus Perkey, MD  misoprostol (CYTOTEC) 200 MCG tablet Place 4 tablets (800 mcg total) vaginally once for 1 dose. May repeat dose between 3 and 72 hours later. 08/05/19 08/05/19  Conard NovakJackson, Stephen D, MD  norgestimate-ethinyl estradiol (ORTHO-CYCLEN) 0.25-35 MG-MCG tablet TAKE 1 TABLET BY MOUTH EVERY DAY Patient not taking: Reported on 01/30/2020 12/28/19   Conard NovakJackson, Stephen D, MD  ondansetron (ZOFRAN ODT) 4 MG disintegrating tablet Take 1 tablet (4 mg total) by mouth every 6 (six) hours as needed for nausea. Patient not taking: Reported on 01/30/2020 08/05/19   Conard NovakJackson, Stephen D, MD  Prenatal Vit-Fe Fumarate-FA (MULTIVITAMIN-PRENATAL) 27-0.8 MG TABS tablet Take 1 tablet by mouth daily at 12 noon. 01/30/20   Conard NovakJackson, Stephen D, MD  traMADol (ULTRAM) 50 MG tablet Take 1 tablet (50 mg total) by mouth every 6 (six) hours as needed. Patient not taking: Reported on 01/30/2020 08/05/19   Conard NovakJackson, Stephen D, MD    Allergies Patient has no known allergies.  Family History  Problem Relation Age of Onset  . Hypertension Maternal Grandmother   . Heart Problems Maternal Grandmother   . Hypertension Mother     Social History Social History   Tobacco Use  . Smoking status: Never Smoker  . Smokeless tobacco:  Never Used  Substance Use Topics  . Alcohol use: No  . Drug use: No    Review of Systems Constitutional: Feels feverish.  General malaise. Eyes: No visual changes. ENT: Positive for nasal congestion.  Negative for sore throat. Cardiovascular: Denies chest pain. Respiratory: Denies shortness of breath. Gastrointestinal: Intermittent lower abdominal cramping.  Persistent nausea and vomiting during pregnancy.   Genitourinary: Negative for dysuria.  Negative for vaginal  bleeding. Musculoskeletal: Negative for neck pain.  Negative for back pain. Integumentary: Negative for rash. Neurological: Negative for headaches, focal weakness or numbness.   ____________________________________________   PHYSICAL EXAM:  VITAL SIGNS: ED Triage Vitals  Enc Vitals Group     BP 02/01/20 1905 109/65     Pulse Rate 02/01/20 1905 (!) 109     Resp 02/01/20 1905 16     Temp 02/01/20 1905 98.2 F (36.8 C)     Temp Source 02/01/20 1905 Oral     SpO2 02/01/20 1905 100 %     Weight 02/01/20 1907 59 kg (130 lb 1.1 oz)     Height 02/01/20 1907 1.651 m (5\' 5" )     Head Circumference --      Peak Flow --      Pain Score 02/01/20 1907 4     Pain Loc --      Pain Edu? --      Excl. in GC? --     Constitutional: Alert and oriented.  Eyes: Conjunctivae are normal.  Head: Atraumatic. Nose: +congestion/rhinnorhea. Mouth/Throat: Patient is wearing a mask. Neck: No stridor.  No meningeal signs.   Cardiovascular: Borderline tachycardia, regular rhythm. Good peripheral circulation. Respiratory: Normal respiratory effort.  No retractions. Gastrointestinal: Soft and nontender. No distention.  GU:  Deferred Musculoskeletal: No lower extremity tenderness nor edema. No gross deformities of extremities. Neurologic:  Normal speech and language. No gross focal neurologic deficits are appreciated.  Skin:  Skin is warm, dry and intact. Psychiatric: Mood and affect are anxious, but appropriately so under the circumstances.  ____________________________________________   LABS (all labs ordered are listed, but only abnormal results are displayed)  Labs Reviewed  COMPREHENSIVE METABOLIC PANEL - Abnormal; Notable for the following components:      Result Value   Sodium 132 (*)    Potassium 3.0 (*)    Glucose, Bld 143 (*)    All other components within normal limits  CBC - Abnormal; Notable for the following components:   Hemoglobin 11.6 (*)    HCT 34.9 (*)    All other  components within normal limits  URINALYSIS, COMPLETE (UACMP) WITH MICROSCOPIC - Abnormal; Notable for the following components:   Color, Urine YELLOW (*)    APPearance HAZY (*)    Glucose, UA >=500 (*)    Hgb urine dipstick SMALL (*)    Ketones, ur 5 (*)    Bacteria, UA RARE (*)    All other components within normal limits  HCG, QUANTITATIVE, PREGNANCY - Abnormal; Notable for the following components:   hCG, Beta Chain, Quant, S 03/31/20 (*)    All other components within normal limits  POC SARS CORONAVIRUS 2 AG -  ED - Abnormal; Notable for the following components:   SARS Coronavirus 2 Ag POSITIVE (*)    All other components within normal limits  LIPASE, BLOOD   ____________________________________________  EKG  No indication for emergent EKG ____________________________________________  RADIOLOGY I, 96,283, personally viewed and evaluated these images (plain radiographs) as part of my medical decision making,  as well as reviewing the written report by the radiologist.  ED MD interpretation: 7-week and 5-day intrauterine pregnancy with a fetal heart rate of 173 and two areas of subchorionic hemorrhage.  Official radiology report(s): US OB LESS THAN 14 WEEKS WITH OB TRANSVAGINAL  Result Date: 02/02/2020 CLINICAL DATA:  Lower abdominal pain EXAM: OBSTETRIC <14 WK [place] AND TRANSVAGINAL OB US TECHNIQUE: Both transabdominal and transvaginal ultrasound examinations were performed for complete evaluation of the gestation as well as the maternal uterus, adnexal regions, and pelvic cul-de-sac. Transvaginal technique was performed to assess early pregnancy. COMPARISON:  None. FINDINGS: Intrauterine gestational sac: Single Yolk sac:  Visualized. Embryo:  Visualized. Cardiac Activity: Visualized. Heart Rate: 173 bpm MSD:   mm    w     d CRL:  14 mm   7 w   5 d                  [place] EDC: 09/14/2020 Subchorionic hemorrhage: 2 areas of subchorionic hemorrhage, one small inferior to the sac and  one moderate superior to the sac Maternal uterus/adnexae: No adnexal mass or abnormal free fluid. IMPRESSION: Seven week 5 day intrauterine pregnancy. Fetal heart rate 173 beats per minute. 2 areas of subchorionic hemorrhage noted. Electronically Signed   By: Charlett Nose M.D.   On: 02/02/2020 00:03    ____________________________________________   PROCEDURES   Procedure(s) performed (including Critical Care):  Procedures   ____________________________________________   INITIAL IMPRESSION / MDM / ASSESSMENT AND PLAN / ED COURSE  As part of my medical decision making, I reviewed the following data within the electronic MEDICAL RECORD NUMBER Nursing notes reviewed and incorporated, Labs reviewed , Old chart reviewed and Notes from prior ED visits   Differential diagnosis includes, but is not limited to, subchorionic hemorrhage, placental abruption, ectopic pregnancy, nonspecific threatened miscarriage.  The patient's lab work is generally reassuring other than some mild hypokalemia 3.0 for which I gave 40 mEq of potassium by mouth.  Vital signs are generally stable other than some very borderline tachycardia.  She has viral symptoms including nasal congestion and a mild cough but no respiratory distress no hypoxemia.  Urinalysis is generally reassuring with no evidence of infection.  She is currently asymptomatic but her ultrasound is notable for two areas of subchorionic hemorrhage which may be new from 2 to 3 days ago when she went to [facility], but I do not have access to the records so I am not certain.  The fetal heart rate recorded was 173 which is fast.  However at this point in her gestation there is no emergent intervention that would be helpful.  I explained to her that there is an abnormal finding of bleeding in the uterus but that there is nothing to do right now.  I strongly encouraged her to call [facility] in the morning to schedule the next available follow-up  appointment.  I also sent a message through [facility] to Dr. Tiburcio Pea who is on-call for [facility] so that he would find a message in the morning and perhaps could help facilitate close follow-up.  I provided written instructions in English and used Google translate to try to provide at least some form of written instructions in [place]  I also encouraged hydration and I provided a prescription for Diclegis to help with her ongoing nausea and vomiting.         Clinical Course as of 02/02/20 0109  Thu Feb 02, 2020  0057 I personally viewed  the rapid COVID-19 swab results and they were positive.  I updated the patient with the results, as well as sending another message to Dr. Tiburcio Pea to let him know as well. [CF]    Clinical Course User Index [CF] Loleta Rose, MD     ____________________________________________  FINAL CLINICAL IMPRESSION(S) / ED DIAGNOSES  Final diagnoses:  Subchorionic hematoma in first trimester, single or unspecified fetus  Nausea and vomiting during pregnancy  COVID-19     MEDICATIONS GIVEN DURING THIS VISIT:  Medications  potassium chloride SA (KLOR-CON) CR tablet 40 mEq (40 mEq Oral Given 02/02/20 0040)     ED Discharge Orders         Ordered    Doxylamine-Pyridoxine (DICLEGIS) 10-10 MG TBEC        02/02/20 0017          *Please note:  Stacie Garrison was evaluated in Emergency Department on 02/02/2020 for the symptoms described in the history of present illness. She was evaluated in the context of the global COVID-19 pandemic, which necessitated consideration that the patient might be at risk for infection with the SARS-CoV-2 virus that causes COVID-19. Institutional protocols and algorithms that pertain to the evaluation of patients at risk for COVID-19 are in a state of rapid change based on information released by regulatory bodies including the CDC and federal and state organizations. These policies and algorithms were followed during the patient's care in  the ED.  Some ED evaluations and interventions may be delayed as a result of limited staffing during and after the pandemic.*  Note:  This document was prepared using Dragon voice recognition software and may include unintentional dictation errors.   Loleta Rose, MD 02/02/20 [phone]

## 2020-02-02 NOTE — Telephone Encounter (Signed)
Patient is scheduled for 10:30 today with AMS via phone

## 2020-02-02 NOTE — Telephone Encounter (Signed)
-----   Message from Nadara Mustard, MD sent at 02/02/2020  7:22 AM EST ----- Regarding: FW: follow up Please sche TELE appt to discuss recent ER visit and results w any OB today Covid pos so tele [facility] seen in ER May need language line Thx PH  ----- Message ----- From: Loleta Rose, MD Sent: 02/02/2020  12:57 AM EST To: Nadara Mustard, MD Subject: follow up                                      Ermalinda Memos,  I also just got back this patient's COVID-19 test, and of course she is positive, just to further complicate everything.  I can't remember if I mentioned in my first message, but in addition to the two areas of subchorionic hemorrhage, the fetal HR seems a bit high at about 173, but again, it does not seem like there is much to do.  Let me know if you feel differently.  Thank you,   -- Kandee Keen  ------------------------------------------------ Eartha Inch. York Cerise, MD Associate Medical Director [facility] Dept of Emergency Medicine [phone] ------------------------------------------------

## 2020-02-02 NOTE — Progress Notes (Signed)
Connected with patient via interpreter [phone]. Patient feeling a lot better today. No more bleeding  [place] Interpreter [phone]     Subjective   Virtual Visit via Telephone Note  I connected with Stacie Garrison on 02/02/20 at 10:30 AM EST by telephone and verified that I am speaking with the correct person using two identifiers.   I discussed the limitations, risks, security and privacy concerns of performing an evaluation and management service by telephone and the availability of in person appointments. I also discussed with the patient that there may be a patient responsible charge related to this service. The patient expressed understanding and agreed to proceed.  The patient was at home I spoke with the patient from my  office The names of people involved in this encounter were: Huel Cote , and Thomasene Mohair, MD.   Stacie Garrison is a 35 y.o. G3P1011 at [redacted]w[redacted]d being seen today for an ER follow up visit.  She is has not had her initial OB visit yet.   -----------------------------------------------------------------------------------     .  Marland Kitchen   . Denies leaking of fluid. She presented to the ER last night for abdominal pain.  She had chills, felt hot, weak.  She had a pelvic ultrasound that showed to small subchorionic hemorrhages.  The embryo was living.    She had a positive COVID19 test in the ER.   She is feeling better today.  She has had COVID19 vaccinations but not the booster.  She feels normal, only occasionally does she feel like she has difficulty breathing when she exerts herself.  Otherwise, she feels normal.   She denies vaginal bleeding.  ----------------------------------------------------------------------------------- The following portions of the patient's history were reviewed and updated as appropriate: allergies, current medications, past family history, past medical history, past social history, past surgical history and problem list. Problem list  updated.   Objective  Last menstrual period 12/12/2019, unknown if currently breastfeeding. Pregravid weight No episode found Total Weight Gain Not found. Urinalysis:      Fetal Status:           Physical Exam could not be performed. Because of the COVID-19 outbreak this visit was performed over the phone and not in person.   Assessment   35 y.o. G3P1011 at [redacted]w[redacted]d gestation with recent diagnosis of COVID19 presenting for ER follow up.   Plan    She meets criteria for mild severity COVID19 and therefore, per the [facility] guidelines, be monitored as an outpatient. She was given instructions on when to present to the ER, including shortness of breath, change in mental status, or any other acute concerns.   Gestational age appropriate obstetric precautions including but not limited to vaginal bleeding, contractions, leaking of fluid and fetal movement were reviewed in detail with the patient.     Follow Up Instructions: Return in about 1 week (around 02/09/2020) for ROB via telephone. Needs [place] interpreter. .   The entirety of this visit was conduct with the help of a hospital provided medical [place] interpreter (as noted above).     I discussed the assessment and treatment plan with the patient. The patient was provided an opportunity to ask questions and all were answered. The patient agreed with the plan and demonstrated an understanding of the instructions.   I provided 15 minutes of non-face-to-face time during this encounter.  Thomasene Mohair, MD  [facility], [facility] 02/02/2020 11:07 AM

## 2020-02-03 ENCOUNTER — Encounter: Payer: 59 | Admitting: Obstetrics and Gynecology

## 2020-02-13 ENCOUNTER — Other Ambulatory Visit: Payer: Self-pay

## 2020-02-13 ENCOUNTER — Ambulatory Visit (INDEPENDENT_AMBULATORY_CARE_PROVIDER_SITE_OTHER): Payer: BLUE CROSS/BLUE SHIELD | Admitting: Obstetrics and Gynecology

## 2020-02-13 ENCOUNTER — Encounter: Payer: Self-pay | Admitting: Obstetrics and Gynecology

## 2020-02-13 DIAGNOSIS — O98511 Other viral diseases complicating pregnancy, first trimester: Secondary | ICD-10-CM | POA: Diagnosis not present

## 2020-02-13 DIAGNOSIS — U071 COVID-19: Secondary | ICD-10-CM | POA: Diagnosis not present

## 2020-02-13 NOTE — Progress Notes (Signed)
Connected with patient via interpreter [phone]. Patient feeling a lot better today. No more bleeding  [place] Interpreter [phone]    Subjective   Virtual Visit via Telephone Note  I connected with Yuval Thi Cardy on 02/13/20 at 10:50 AM EST by telephone and verified that I am speaking with the correct person using two identifiers.   Interpreter Ellie: [phone]  I discussed the limitations, risks, security and privacy concerns of performing an evaluation and management service by telephone and the availability of in person appointments. I also discussed with the patient that there may be a patient responsible charge related to this service. The patient expressed understanding and agreed to proceed.  The patient was at home. I spoke with the patient from my office The names of people involved in this encounter were: Carlena Hurl, MD, Deborha Payment, PA-S. Also, present was the [place] interpreter, noted above.  Brittinee Risk is a 35 y.o. G3P1011 at [redacted]w[redacted]d being seen today for an ER follow up visit.  She is has not had her initial OB visit yet.   -----------------------------------------------------------------------------------   .  Marland Kitchen   . Denies leaking of fluid. Denies vaginal bleeding She presented to the ER a week ago for abdominal pain.  She had chills, felt hot, weak.  She had a pelvic ultrasound that showed two small subchorionic hemorrhages.  The embryo was living.    She had a positive COVID19 test in the ER (02/02/20).   She is feeling better today.  She has had COVID19 vaccinations but not the booster.  She feels normal, only occasionally does she feel like she has difficulty breathing when she exerts herself.  Otherwise, she feels normal.   She has some indigestion and heartburn. She has occasional nausea with occasional emesis.  ----------------------------------------------------------------------------------- The following portions of the patient's history were  reviewed and updated as appropriate: allergies, current medications, past family history, past medical history, past social history, past surgical history and problem list. Problem list updated.   Objective  Last menstrual period 12/12/2019, at [redacted]w[redacted]d. Pregravid weight  Total Weight Gain  Urinalysis:      Fetal Status:           Physical Exam could not be performed. Because of the COVID-19 outbreak this visit was performed over the phone and not in person.   Assessment   35 y.o. G3P1011 G2P1001 at [redacted]w[redacted]d gestation with recent diagnosis of COVID19 presenting for ER follow up.   Plan    She meets criteria for mild severity COVID19 and therefore, per the [facility] guidelines, be monitored as an outpatient. She was given instructions on when to present to the ER, including shortness of breath, change in mental status, or any other acute concerns.   Gestational age appropriate obstetric precautions including but not limited to vaginal bleeding, contractions, leaking of fluid and fetal movement were reviewed in detail with the patient.    Follow Up Instructions: Return in about 2 weeks (around 02/27/2020) for New OB visit.   The entirety of this visit was conduct with the help of a hospital provided medical [place] interpreter (as noted above).     I discussed the assessment and treatment plan with the patient. The patient was provided an opportunity to ask questions and all were answered. The patient agreed with the plan and demonstrated an understanding of the instructions.   I provided 15 minutes of non-face-to-face time during this encounter. The medical [place] interpreter was present throughout the conversation.  Thomasene Mohair, MD  [facility], [facility] 02/13/2020 11:30 AM

## 2020-02-27 ENCOUNTER — Ambulatory Visit (INDEPENDENT_AMBULATORY_CARE_PROVIDER_SITE_OTHER): Payer: 59 | Admitting: Obstetrics and Gynecology

## 2020-02-27 ENCOUNTER — Encounter: Payer: Self-pay | Admitting: Obstetrics and Gynecology

## 2020-02-27 ENCOUNTER — Ambulatory Visit (INDEPENDENT_AMBULATORY_CARE_PROVIDER_SITE_OTHER): Payer: 59

## 2020-02-27 ENCOUNTER — Other Ambulatory Visit: Payer: Self-pay

## 2020-02-27 VITALS — BP 118/68 | Wt 129.0 lb

## 2020-02-27 DIAGNOSIS — Z369 Encounter for antenatal screening, unspecified: Secondary | ICD-10-CM

## 2020-02-27 DIAGNOSIS — Z1379 Encounter for other screening for genetic and chromosomal anomalies: Secondary | ICD-10-CM

## 2020-02-27 DIAGNOSIS — Z3201 Encounter for pregnancy test, result positive: Secondary | ICD-10-CM | POA: Diagnosis not present

## 2020-02-27 DIAGNOSIS — Z3A11 11 weeks gestation of pregnancy: Secondary | ICD-10-CM

## 2020-02-27 DIAGNOSIS — Z8759 Personal history of other complications of pregnancy, childbirth and the puerperium: Secondary | ICD-10-CM

## 2020-02-27 DIAGNOSIS — Z3480 Encounter for supervision of other normal pregnancy, unspecified trimester: Secondary | ICD-10-CM

## 2020-02-27 DIAGNOSIS — Z113 Encounter for screening for infections with a predominantly sexual mode of transmission: Secondary | ICD-10-CM

## 2020-02-27 DIAGNOSIS — Z31438 Encounter for other genetic testing of female for procreative management: Secondary | ICD-10-CM

## 2020-02-27 LAB — POCT URINALYSIS DIPSTICK OB
Glucose, UA: NEGATIVE
POC,PROTEIN,UA: NEGATIVE

## 2020-02-27 LAB — OB RESULTS CONSOLE VARICELLA ZOSTER ANTIBODY, IGG: Varicella: IMMUNE

## 2020-02-27 NOTE — Progress Notes (Signed)
New Obstetric Patient H&P    Chief Complaint: "Desires prenatal care"   History of Present Illness: Patient is a 35 y.o. G3P1011 Not Hispanic or Latino female, presents with amenorrhea and positive home pregnancy test. Patient's last menstrual period was 12/12/2019. and based on her  LMP, her EDD is Estimated Date of Delivery: 09/17/20 and her EGA is [redacted]w[redacted]d.  Her last pap smear was on 07/19/2019 and was NILM HPV negative.    Her last menstrual period was normal. Since her LMP she claims she has experienced some nausea, fatigue, breast tenderness. She denies vaginal bleeding. Her past medical history is noncontributory. Her prior pregnancies are notable for none  Since her LMP, she admits to the use of tobacco products  no There are cats in the home in the home  No She admits close contact with children on a regular basis  yes  She has had chicken pox in the past yes She has had Tuberculosis exposures, symptoms, or previously tested positive for TB   no Current or past history of domestic violence. no  Genetic Screening/Teratology Counseling: (Includes patient, baby's father, or anyone in either family with:)   1. Patient's age >/= 70 at [facility]  no 2. Thalassemia ([place], [place], Mediterranean, or Asian background): MCV<80  no 3. Neural tube defect (meningomyelocele, spina bifida, anencephaly)  no 4. Congenital heart defect  no  5. Down syndrome  no 6. Tay-Sachs (Jewish, [place]  no 7. Canavan's Disease  no 8. Sickle cell disease or trait (African)  no  9. Hemophilia or other blood disorders  no  10. Muscular dystrophy  no  11. Cystic fibrosis  no  12. Huntington's Chorea  no  13. Mental retardation/autism  no 14. Other inherited genetic or chromosomal disorder  no 15. Maternal metabolic disorder (DM, PKU, etc)  no 16. Patient or FOB with a child with a birth defect not listed above no  16a. Patient or FOB with a birth defect themselves no 17. Recurrent pregnancy loss, or  stillbirth  no  18. Any medications since LMP other than prenatal vitamins (include vitamins, supplements, OTC meds, drugs, alcohol)  no 19. Any other genetic/environmental exposure to discuss  no  Infection History:   1. Lives with someone with TB or TB exposed  no  2. Patient or partner has history of genital herpes  no 3. Rash or viral illness since LMP  no 4. History of STI (GC, CT, HPV, syphilis, HIV)  no 5. History of recent travel :  no  Other pertinent information:  no     Review of Systems:10 point review of systems negative unless otherwise noted in HPI  Past Medical History:  Patient Active Problem List   Diagnosis Date Noted   COVID-19 affecting pregnancy in first trimester 02/02/2020   Postpartum care following vaginal delivery 03/02/2016   Normal labor 02/29/2016   Indication for care in labor and delivery, antepartum 02/28/2016   Irregular contractions 02/28/2016    Past Surgical History:  Past Surgical History:  Procedure Laterality Date   APPENDECTOMY      Gynecologic History: Patient's last menstrual period was 12/12/2019.  Obstetric History: G3P1011  Family History:  Family History  Problem Relation Age of Onset   Hypertension Maternal Grandmother    Heart Problems Maternal Grandmother    Hypertension Mother     Social History:  Social History   Socioeconomic History   Marital status: Married    Spouse name: Not on file  Number of children: Not on file   Years of education: Not on file   Highest education level: Not on file  Occupational History   Not on file  Tobacco Use   Smoking status: Never Smoker   Smokeless tobacco: Never Used  Substance and Sexual Activity   Alcohol use: No   Drug use: No   Sexual activity: Yes    Birth control/protection: None  Other Topics Concern   Not on file  Social History Narrative   Not on file   Social Determinants of Health   Financial Resource Strain: Not on file  Food  Insecurity: Not on file  Transportation Needs: Not on file  Physical Activity: Not on file  Stress: Not on file  Social Connections: Not on file  Intimate Partner Violence: Not on file    Allergies:  No Known Allergies  Medications: Prior to Admission medications   Medication Sig Start Date End Date Taking? Authorizing Provider  Prenatal Vit-Fe Fumarate-FA (MULTIVITAMIN-PRENATAL) 27-0.8 MG TABS tablet Take 1 tablet by mouth daily at 12 noon. 01/30/20  Yes Conard Novak, MD  Doxylamine-Pyridoxine (DICLEGIS) 10-10 MG TBEC Two tablets at bedtime on day 1 and 2; if symptoms persist, take 1 tablet in morning and 2 tablets at bedtime on day 3; if symptoms persist, may increase to 1 tablet in morning, 1 tablet mid-afternoon, and 2 tablets at bedtime on day 4 for a maximum of 4 tablets per day. Use the minimum dose necessary to control your symptoms. Patient not taking: Reported on 02/02/2020 02/02/20   Loleta Rose, MD  misoprostol (CYTOTEC) 200 MCG tablet Place 4 tablets (800 mcg total) vaginally once for 1 dose. May repeat dose between 3 and 72 hours later. 08/05/19 08/05/19  Conard Novak, MD  norgestimate-ethinyl estradiol (ORTHO-CYCLEN) 0.25-35 MG-MCG tablet TAKE 1 TABLET BY MOUTH EVERY DAY Patient not taking: No sig reported 12/28/19   Conard Novak, MD  ondansetron (ZOFRAN ODT) 4 MG disintegrating tablet Take 1 tablet (4 mg total) by mouth every 6 (six) hours as needed for nausea. Patient not taking: No sig reported 08/05/19   Conard Novak, MD  traMADol (ULTRAM) 50 MG tablet Take 1 tablet (50 mg total) by mouth every 6 (six) hours as needed. Patient not taking: No sig reported 08/05/19   Conard Novak, MD    Physical Exam Vitals: Blood pressure 118/68, weight 129 lb (58.5 kg), last menstrual period 12/12/2019, unknown if currently breastfeeding.  General: NAD HEENT: normocephalic, anicteric Thyroid: no enlargement, no palpable nodules Pulmonary: No increased work of  breathing, CTAB Cardiovascular: RRR, distal pulses 2+ Abdomen: NABS, soft, non-tender, non-distended.  Umbilicus without lesions.  No hepatomegaly, splenomegaly or masses palpable. No evidence of hernia  Genitourinary:  External: Normal external female genitalia.  Normal urethral meatus, normal  Bartholin's and Skene's glands.    Vagina: Normal vaginal mucosa, no evidence of prolapse.    Cervix: Grossly normal in appearance, no bleeding  Uterus: Non-enlarged, mobile, normal contour.  No CMT  Adnexa: ovaries non-enlarged, no adnexal masses  Rectal: deferred Extremities: no edema, erythema, or tenderness Neurologic: Grossly intact Psychiatric: mood appropriate, affect full   Assessment: 35 y.o. G3P1011 at [redacted]w[redacted]d presenting to initiate prenatal care  Plan: 1) Avoid alcoholic beverages. 2) Patient encouraged not to smoke.  3) Discontinue the use of all non-medicinal drugs and chemicals.  4) Take prenatal vitamins daily.  5) Nutrition, food safety (fish, cheese advisories, and high nitrite foods) and exercise discussed. 6) Hospital and  practice style discussed with cross coverage system.  7) Genetic Screening, such as with 1st Trimester Screening, cell free fetal DNA, AFP testing, and Ultrasound, as well as with amniocentesis and CVS as appropriate, is discussed with patient. At the conclusion of today's visit patient requested genetic testing 8) Dating scan today confirms dates  Vena Austria, MD, [facility], [facility] 02/27/2020, 10:55 AM

## 2020-02-28 DIAGNOSIS — Z31438 Encounter for other genetic testing of female for procreative management: Secondary | ICD-10-CM | POA: Insufficient documentation

## 2020-02-28 DIAGNOSIS — Z3480 Encounter for supervision of other normal pregnancy, unspecified trimester: Secondary | ICD-10-CM | POA: Insufficient documentation

## 2020-02-28 LAB — RPR+RH+ABO+RUB AB+AB SCR+CB...
Antibody Screen: NEGATIVE
HIV Screen 4th Generation wRfx: NONREACTIVE
Hematocrit: 36.1 % (ref 34.0–46.6)
Hemoglobin: 11.8 g/dL (ref 11.1–15.9)
Hepatitis B Surface Ag: NEGATIVE
MCH: 28.4 pg (ref 26.6–33.0)
MCHC: 32.7 g/dL (ref 31.5–35.7)
MCV: 87 fL (ref 79–97)
Platelets: 247 10*3/uL (ref 150–450)
RBC: 4.16 x10E6/uL (ref 3.77–5.28)
RDW: 13.8 % (ref 11.7–15.4)
RPR Ser Ql: NONREACTIVE
Rh Factor: POSITIVE
Rubella Antibodies, IGG: 20.6 index (ref >0.99)
Varicella zoster IgG: 688 index (ref >165)
WBC: 9.9 10*3/uL (ref 3.4–10.8)

## 2020-02-29 LAB — URINE CULTURE

## 2020-03-03 LAB — MATERNIT 21 PLUS CORE, BLOOD
Fetal Fraction: 8
Result (T21): NEGATIVE
Trisomy 13 (Patau syndrome): NEGATIVE
Trisomy 18 (Edwards syndrome): NEGATIVE
Trisomy 21 (Down syndrome): NEGATIVE

## 2020-03-16 LAB — INHERITEST CORE(CF97,SMA,FRAX)

## 2020-03-26 ENCOUNTER — Encounter: Payer: 59 | Admitting: Obstetrics and Gynecology

## 2020-03-26 ENCOUNTER — Other Ambulatory Visit: Payer: Self-pay

## 2020-03-26 ENCOUNTER — Ambulatory Visit (INDEPENDENT_AMBULATORY_CARE_PROVIDER_SITE_OTHER): Payer: 59 | Admitting: Obstetrics and Gynecology

## 2020-03-26 ENCOUNTER — Encounter: Payer: Self-pay | Admitting: Obstetrics and Gynecology

## 2020-03-26 ENCOUNTER — Other Ambulatory Visit (HOSPITAL_COMMUNITY)
Admission: RE | Admit: 2020-03-26 | Discharge: 2020-03-26 | Disposition: A | Discharge status: Home / Self Care | Payer: 59 | Source: Ambulatory Visit | Attending: Obstetrics and Gynecology | Admitting: Obstetrics and Gynecology

## 2020-03-26 VITALS — BP 110/70 | Wt 131.6 lb

## 2020-03-26 DIAGNOSIS — Z3480 Encounter for supervision of other normal pregnancy, unspecified trimester: Secondary | ICD-10-CM

## 2020-03-26 DIAGNOSIS — Z3A15 15 weeks gestation of pregnancy: Secondary | ICD-10-CM

## 2020-03-26 DIAGNOSIS — O2342 Unspecified infection of urinary tract in pregnancy, second trimester: Secondary | ICD-10-CM

## 2020-03-26 LAB — POCT URINALYSIS DIPSTICK OB
Glucose, UA: NEGATIVE
POC,PROTEIN,UA: NEGATIVE

## 2020-03-26 MED ORDER — NITROFURANTOIN MONOHYD MACRO 100 MG PO CAPS: 100.0000 mg | ORAL_CAPSULE | Freq: Two times a day (BID) | ORAL | 1 refills | Status: DC

## 2020-03-26 NOTE — Patient Instructions (Addendum)
Second Trimester of Pregnancy  The second trimester of pregnancy is from week 13 through week 27. This is months 4 through 6 of pregnancy. The second trimester is often a time when you feel your best. Your body has adjusted to being pregnant, and you begin to feel better physically. During the second trimester:  Morning sickness has lessened or stopped completely.  You may have more energy.  You may have an increase in appetite. The second trimester is also a time when the unborn baby (fetus) is growing rapidly. At the end of the sixth month, the fetus may be up to 12 inches long and weigh about 1 pounds. You will likely begin to feel the baby move (quickening) between 16 and 20 weeks of pregnancy. Body changes during your second trimester Your body continues to go through many changes during your second trimester. The changes vary and generally return to normal after the baby is born. Physical changes  Your weight will continue to increase. You will notice your lower abdomen bulging out.  You may begin to get stretch marks on your hips, abdomen, and breasts.  Your breasts will continue to grow and to become tender.  Dark spots or blotches (chloasma or mask of pregnancy) may develop on your face.  A dark line from your belly button to the pubic area (linea nigra) may appear.  You may have changes in your hair. These can include thickening of your hair, rapid growth, and changes in texture. Some people also have hair loss during or after pregnancy, or hair that feels dry or thin. Health changes  You may develop headaches.  You may have heartburn.  You may develop constipation.  You may develop hemorrhoids or swollen, bulging veins (varicose veins).  Your gums may bleed and may be sensitive to brushing and flossing.  You may urinate more often because the fetus is pressing on your bladder.  You may have back pain. This is caused by: ? Weight gain. ? Pregnancy hormones that  are relaxing the joints in your pelvis. ? A shift in weight and the muscles that support your balance. Follow these instructions at home: Medicines  Follow your health care provider's instructions regarding medicine use. Specific medicines may be either safe or unsafe to take during pregnancy. Do not take any medicines unless approved by your health care provider.  Take a prenatal vitamin that contains at least 600 micrograms (mcg) of folic acid. Eating and drinking  Eat a healthy diet that includes fresh fruits and vegetables, whole grains, good sources of protein such as meat, eggs, or tofu, and low-fat dairy products.  Avoid raw meat and unpasteurized juice, milk, and cheese. These carry germs that can harm you and your baby.  You may need to take these actions to prevent or treat constipation: ? Drink enough fluid to keep your urine pale yellow. ? Eat foods that are high in fiber, such as beans, whole grains, and fresh fruits and vegetables. ? Limit foods that are high in fat and processed sugars, such as fried or sweet foods. Activity  Exercise only as directed by your health care provider. Most people can continue their usual exercise routine during pregnancy. Try to exercise for 30 minutes at least 5 days a week. Stop exercising if you develop contractions in your uterus.  Stop exercising if you develop pain or cramping in the lower abdomen or lower back.  Avoid exercising if it is very hot or humid or if you are at   a high altitude.  Avoid heavy lifting.  If you choose to, you may have sex unless your health care provider tells you not to. Relieving pain and discomfort  Wear a supportive bra to prevent discomfort from breast tenderness.  Take warm sitz baths to soothe any pain or discomfort caused by hemorrhoids. Use hemorrhoid cream if your health care provider approves.  Rest with your legs raised (elevated) if you have leg cramps or low back pain.  If you develop  varicose veins: ? Wear support hose as told by your health care provider. ? Elevate your feet for 15 minutes, 3-4 times a day. ? Limit salt in your diet. Safety  Wear your seat belt at all times when driving or riding in a car.  Talk with your health care provider if someone is verbally or physically abusive to you. Lifestyle  Do not use hot tubs, steam rooms, or saunas.  Do not douche. Do not use tampons or scented sanitary pads.  Avoid cat litter boxes and soil used by cats. These carry germs that can cause birth defects in the baby and possibly loss of the fetus by miscarriage or stillbirth.  Do not use herbal remedies, alcohol, illegal drugs, or medicines that are not approved by your health care provider. Chemicals in these products can harm your baby.  Do not use any products that contain nicotine or tobacco, such as cigarettes, e-cigarettes, and chewing tobacco. If you need help quitting, ask your health care provider. General instructions  During a routine prenatal visit, your health care provider will do a physical exam and other tests. He or she will also discuss your overall health. Keep all follow-up visits. This is important.  Ask your health care provider for a referral to a local prenatal education class.  Ask for help if you have counseling or nutritional needs during pregnancy. Your health care provider can offer advice or refer you to specialists for help with various needs. Where to find more information  American Pregnancy Association: americanpregnancy.org  American College of Obstetricians and Gynecologists: acog.org/en/Womens%20Health/Pregnancy  Office on Women's Health: womenshealth.gov/pregnancy Contact a health care provider if you have:  A headache that does not go away when you take medicine.  Vision changes or you see spots in front of your eyes.  Mild pelvic cramps, pelvic pressure, or nagging pain in the abdominal area.  Persistent nausea,  vomiting, or diarrhea.  A bad-smelling vaginal discharge or foul-smelling urine.  Pain when you urinate.  Sudden or extreme swelling of your face, hands, ankles, feet, or legs.  A fever. Get help right away if you:  Have fluid leaking from your vagina.  Have spotting or bleeding from your vagina.  Have severe abdominal cramping or pain.  Have difficulty breathing.  Have chest pain.  Have fainting spells.  Have not felt your baby move for the time period told by your health care provider.  Have new or increased pain, swelling, or redness in an arm or leg. Summary  The second trimester of pregnancy is from week 13 through week 27 (months 4 through 6).  Do not use herbal remedies, alcohol, illegal drugs, or medicines that are not approved by your health care provider. Chemicals in these products can harm your baby.  Exercise only as directed by your health care provider. Most people can continue their usual exercise routine during pregnancy.  Keep all follow-up visits. This is important. This information is not intended to replace advice given to you by your   health care provider. Make sure you discuss any questions you have with your health care provider. Document Revised: 06/15/2019 Document Reviewed: 04/21/2019 Elsevier Patient Education  2021 Elsevier Inc.   Ba thng th? hai c?a [place] k? Second Trimester of Pregnancy  Ba thng th? hai c?a thai k? l t? tu?n 13 ??n tu?n 27. ?y l thng th? 4 ??n thng th? 6 c?a [place] k?Oley Balm th? hai th??ng l th?i gian m qu v? c?m th?y tho?i mi nh?t. C? th? c?a qu v? c?ng ? ?i?u ch?nh ?? ph h?p v?i vi?c mang thai v qu v? b?t ??u c?m th?y ?n h?n v? m?t th? ch?t. Trong ba thng th? hai:  ?m nghn ? gi?m ho?c h?t hon ton.  Qu v? c th? c nhi?u n?ng l??ng h?n.  Qu v? c th? t?ng c?m gic ngon mi?ng. Ba thng th? hai c?ng l th?i gian khi em b ch?a sinh (bo [place]) ?ang l?n ln nhanh chng. Vo cu?i thng th? su, bo  [place] c th? di kho?ng 12 in s? v n?ng kho?ng 1 pao. Qu v? c kh? n?ng s? b?t ??u c?m th?y em b c? ??ng (thai ??p l?n ??u) trong kho?n tu?n 16 ??n tu?n 20 c?a [place] k?. Cc thay ??i c? th? trong ba thng th? hai c?a [place] k? C? th? qu v? b?t ??u tr?i qua nhi?u thay ??i trong ba thng th? hai c?a [place] k?. Nh?ng thay ??i ny khc nhau v th??ng tr? l?i bnh th??ng sau khi sinh. Cc thay ??i v? th? ch?t  Cn n?ng c?a qu v? s? ti?p t?c t?ng. Qu v? s? nh?n th?y b?ng ph?ng to ra.  Qu v? c th? b?t ??u c cc v?t r?n da trn hng, b?ng v v.  Ng?c qu v? s? ti?p t?c pht tri?n v nh?y c?m ?au.  Cc ??m ho?c v?t t?i mu (rm m hay m?t n? [place] k?) c th? pht tri?n trn m?t qu v?.  M?t ???ng s?m mu ch?y t? r?n ??n vng lng mu (???ng linea nigra) c th? xu?t hi?n.  Qu v? c th? c nh?ng thay ??i v? tc. Nh?ng thay ??i ny c th? bao g?m tc dy ln, tc m?c nhanh h?n v thay ??i v? k?t c?u tc. M?t s? ng??i c?ng b? r?ng tc trong khi ho?c sau khi mang thai, ho?c c?m th?y tc kh ho?c m?ng. Cc thay ??i v? s?c kh?e  Qu v? c th? b? ?au ??u.  Qu v? c th? b? ? nng.  Qu v? c th? b? to bn.  Qu v? c th? b? tr? ho?c s?ng, phnh t?nh m?ch (gin t?nh m?ch).  N??u (l?i) c?a qu v? c th? ch?y mu v c th? nh?y c?m v?i vi?c ?nh r?ng v dng ch? nha khoa.  Qu v? c th? ?i ti?u th??ng xuyn h?n v bo thai p vo bng quang c?a qu v?.  Quy? vi? c th? bi? ?au l?ng. V?n ?? ny l do: ? T?ng cn. ? Cc hc mn thai k? lm th? gin cc kh?p ? khung ch?u c?a qu v?. ? Thay ??i v? cn n?ng v cc c? h? tr? th?ng b?ng c?a qu v?. Tun th? nh?ng h??ng d?n ny ? nh: Thu?c  Tun th? cc ch? d?n c?a chuyn gia ch?m [place] s?c kh?e v? vi?c s? d?ng thu?c. M?t s? lo?i thu?c c? th? co? th? an ton ho?c khng an ton [place] dng trong qu trnh [place]. Khng  dng b?t k? lo?i thu?c no tr? khi ???c chuyn gia ch?m [place] s?c kh?e c?a qu v? ch?p thu?n.  U?ng vitamin tr??c khi sinh c t nh?t  600 microgram (mcg) axit folic. ?n v u?ng  ?n ch? ?? ?n lnh m?nh bao g?m tri cy t??i v rau c?, ng? c?c nguyn cm, cc ngu?n protein t?t nh? th?t, tr?ng, ??u h? v cc s?n ph?m s?a t bo.  Trnh th?t s?ng v n??c p tri cy, s?a v pho mt ch?a ti?t trng. Nh?ng th?c ph?m ny mang m?m b?nh c th? c h?i cho qu v? v con qu v?.  Qu v? c th? c?n th?c hi?n cc hnh ??ng ny ?? ng?n ng?a ho?c ?i?u tr? to bn: ? U?ng ?? n??c ?? gi? cho n??c ti?u c mu vng nh?t. ? ?n th?c ?n giu ch?t x? nh? ??u, ng? c?c nguyn cm, tri cy t??i v rau. ? H?n ch? cc lo?i th?c ?n giu ch?t bo v ???ng tinh luy?n, ch?ng h?n nh? ?? ?n chin/rn ho?c ?? ng?t. Ho?t ??ng  Ch? t?p th? d?c theo ch? d?n c?a chuyn gia ch?m [place] s?c kh?e. H?u h?t ph? n? ??u c th? ti?p t?c ch? ?? t?p luy?n bnh th??ng c?a h? trong khi mang [place]. C? g??ng t?p th? du?c 30 phu?t m?i nga?y, t nh?t 5 nga?y m?i tu?n. Ng?ng t?p th? d?c n?u qu v? c cc c?n co th?t trong t? cung.  D?ng t?p th? d?c n?u qu v? b? ?au ho?c co th?t ? b?ng d??i ho?c th?t l?ng.  Trnh t?p th? d?c n?u qu nng ho?c qu ?m, ho?c n?u qu v? ? n?i c ?? cao l?n.  Trnh nng v?t n?ng.  N?u qu v? ch?n, quy? vi? c th? quan h? tnh d?c tr? khi chuyn gia ch?m [place] s?c kh?e ni qu v? khng ???c quan h?. Gi?m ?au v gi?m c?m gic kh ch?u  M?c m?t chi?c o ng?c nng ?? v ?? ng?n ng?a c?m gic kh ch?u do v b? nh?y c?m ?au.  T?m b?n ng?i n??c ?m ?? lm d?u ?au ho?c d?u c?m gic kh ch?u do b?nh tr? gy ra. S? d?ng kem ?i?u tr? b?nh tr? n?u chuyn gia ch?m [place] s?c kh?e ch?p thu?n.  Ngh? ng?i trong khi nng caochn c?a qu v? n?u qu v? b? chu?t rt chn ho?c ?au vng th?t l?ng.  N?u qu v? b? gin t?nh m?ch: ? ?i t?t h? tr? theo ch? d?n c?a chuyn gia ch?m [place] s?c kh?e. ? Nng cao chn c?a qu v? trong 15 pht, 3-4 l?n m?t ngy. ? H?n ch? mu?i trong ch? ?? ?n c?a qu v?. An ton  Lun th?t dy an ton khi li xe ho?c ng?i trn xe h?i.  Hy  trao ??i v?i chuyn gia ch?m [place] s?c kh?e n?u ai ? l?ng m? qu v? b?ng l?i ni ho?c thn th?. L?i s?ng  Khng s? d?ng b?n t?m n??c nng, phng xng h?i, ho?c nh t?m h?i.  Khng th?t r?a. Khng s? d?ng nu?t b?ng v? sinh ho?c b?ng v? sinh c mi th?m.  Trnh h?p ?i v? sinh c?a mo v ??t v? sinh dnh cho mo. Nh?ng th?? na?y mang m?m b?nh c th? gy ra d? t?t b?m sinh ? tr? v c th? m?t [place] nhi do s?y [place] ho?c thai ch?t l?u.  Khng s? d?ng thu?c th?o d??c, r??u, ma ty b?t h?p php ho?c thu?c ch?a ???c chuyn gia ch?m   s?c kh?e c?a qu v? ch?p thu?n. Ha ch?t trong cc s?n ph?m ny c th? c h?i cho con qu v?.  Khng s? d?ng b?t k? s?n ph?m no c nicotine ho?c thu?c l, ch?ng ha?n nh? thu?c l d?ng ht, thu?c l ?i?n t? v thu?c l d?ng nhai. N?u qu v? c?n gip ?? ?? cai thu?c, hy h?i chuyn gia ch?m [place] s?c kh?e. H??ng d?n chung  Trong m?t l?n th?m khm tr??c khi sinh th??ng quy, chuyn gia ch?m [place] s?c kh?e s? ti?n hnh khm th?c th? v lm cc xt nghi?m khc. Ng??i ? c?ng s? th?o lu?n v? s?c kh?e t?ng th? c?a qu v?. Tun th? theo t?t c? cc l?n ??n khm l?i. ?i?u ny c vai tr quan tr?ng.  H?i chuyn gia ch?m [place] s?c kh?e c?a quy? vi? ?? ???c gi?i thi?u ??n m?t l?p h?c tr???c khi sinh t?i ??a ph??ng.  Yu c?u gip ?? n?u quy? vi? c nhu c?u t? v?n ho?c nhu c?u dinh d??ng trong th??i gian mang [place]. Chuyn gia ch?m [place] s?c kh?e c?a quy? vi? c th? co? l?i khuyn ho?c gi?i thi?u quy? vi? ??n cc chuyn gia ?? ???c gip ?? v? ca?c nhu c?u khc nhau. N?i tm ki?m thm thng tin  American Pregnancy Association (Hi?p h?i Mang [place] M?): americanpregnancy.org  [employer] (Hi?p h?i S?n Ph? Amanda Cockayne K?): [email or web address]  Office on [employer] (V?n phng S?c kho? Ph? n?): [email or web address] Hy lin l?c v?i chuyn gia ch?m [place] s?c kh?e n?u qu v? c:  ?au ??u khng h?t sau khi dng thu?c.  Thay ??i th?  l?c ho?c qu v? nhn th?y cc ??m ? pha tr??c m?t.  Co th?t nh? ? vng ch?u, t?c n?ng ? vng ch?u, ?au m ? ? vng b?ng.  Bu?n nn, nn m?a ho?c tiu ch?y dai d?ng.  Kh h? ? m ??o c mi hi ho?c n??c ti?u mi hi.  ?au khi qu v? ?i ti?u.  S?ng ??t ng?t ho?c r?t nhi?u ? m?t, bn tay, c? chn, bn chn ho?c chn.  S?t. Yu c?u tr? gip ngay l?p t?c n?u qu v?:  B? r? d?ch ? m ??o.  C ra ??m mu ho?c ch?y mu ? m ??o.  B? co th?t ho?c ?au r?t nhi?u ? b?ng.  Kh th?.  B? ?au ng?c.  B? ng?t x?u.  Khng c?m th?y em b c?? ??ng trong kho?ng th??i gian m chuyn gia ch?m [place] s??c kh?e ch? d?n.  B? ?au m?i ho?c ?au t?ng ln, s?ng ho?c t?y ?? ? m?t cnh tay ho?c chn. Tm t?t  Ba thng th? hai c?a [place] k? l t? tu?n 13 ??n tu?n 27 (thng th? 4 ??n th? thng th? 6).  Khng s? d?ng thu?c th?o d??c, r??u, ma ty b?t h?p php ho?c thu?c ch?a ???c chuyn gia ch?m [place] s?c kh?e c?a qu v? ch?p thu?n. Ha ch?t trong cc s?n ph?m ny c th? c h?i cho con qu v?.  Ch? t?p th? d?c theo ch? d?n c?a chuyn gia ch?m [place] s?c kh?e. H?u h?t ph? n? ??u c th? ti?p t?c ch? ?? t?p luy?n bnh th??ng c?a h? trong khi mang [place].  Tun th? theo t?t c? cc l?n ??n khm l?i. ?i?u ny c vai tr quan tr?ng. Thng tin ny khng nh?m m?c ?ch thay th? cho l?i khuyn m chuyn gia ch?m [place] s?c kh?e ni v?i qu v?. Hy b?o ??m  qu v? ph?i th?o lu?n b?t k? v?n ?? g m qu v? c v?i chuyn gia ch?m [place] s?c kh?e c?a qu v?. Document Revised: 06/23/2019 Document Reviewed: 06/23/2019 Elsevier Patient Education  2021 [employer].

## 2020-03-26 NOTE — Progress Notes (Signed)
    Routine Prenatal Care Visit  Subjective  Stacie Garrison is a 35 y.o. G3P1011 at [redacted]w[redacted]d being seen today for ongoing prenatal care.  She is currently monitored for the following issues for this low-risk pregnancy and has COVID-19 affecting pregnancy in first trimester and Supervision of other normal pregnancy, antepartum on their problem list.  ----------------------------------------------------------------------------------- Patient reports no complaints.   Contractions: Not present. Vag. Bleeding: None.  Movement: Absent. Denies leaking of fluid.  ----------------------------------------------------------------------------------- The following portions of the patient's history were reviewed and updated as appropriate: allergies, current medications, past family history, past medical history, past social history, past surgical history and problem list. Problem list updated.   Objective  Blood pressure 110/70, weight 131 lb 9.6 oz (59.7 kg), last menstrual period 12/12/2019, unknown if currently breastfeeding. Pregravid weight Pregravid weight not on file Total Weight Gain Not found. Urinalysis:      Fetal Status: Fetal Heart Rate (bpm): 150   Movement: Absent     General:  Alert, oriented and cooperative. Patient is in no acute distress.  Skin: Skin is warm and dry. No rash noted.   Cardiovascular: Normal heart rate noted  Respiratory: Normal respiratory effort, no problems with respiration noted  Abdomen: Soft, gravid, appropriate for gestational age. Pain/Pressure: Absent     Pelvic:  Cervical exam deferred        Extremities: Normal range of motion.     Mental Status: Normal mood and affect. Normal behavior. Normal judgment and thought content.     Assessment   35 y.o. G3P1011 at [redacted]w[redacted]d by  09/17/2020, by Last Menstrual Period presenting for routine prenatal visit  Plan    G2 Problems (from 02/27/20 to present)    Problem Noted Resolved   Supervision of other normal  pregnancy, antepartum 02/28/2020 by Vena Austria, MD No   Overview Addendum 03/26/2020  4:07 PM by Natale Milch, MD     Nursing Staff Provider  Office Location  [facility]ating   LMP = 11 [place]  Language  [place] Anatomy US    Flu Vaccine   Genetic Screen  NIPS: normal XY  TDaP vaccine    Hgb A1C or  GTT Early : Third trimester :   Covid    LAB RESULTS   Rhogam   not needed Blood Type O/Positive/-- (02/07 1114)   Feeding Plan  Antibody Negative (02/07 1114)  Contraception  Rubella 20.60 (02/07 1114)  Circumcision  RPR Non Reactive (02/07 1114)   Pediatrician   HBsAg Negative (02/07 1114)   Support Person  HIV Non Reactive (02/07 1114)  Prenatal Classes  Varicella  immune    GBS  (For PCN allergy, check sensitivities)   BTL Consent     VBAC Consent  Pap  NIL    Hgb Electro   negative    CF  negative     SMA  negative              Previous Version       Rx for UTI today CT/GC collecedt today.   Gestational age appropriate obstetric precautions including but not limited to vaginal bleeding, contractions, leaking of fluid and fetal movement were reviewed in detail with the patient.    Return in about 4 weeks (around 04/23/2020) for ROB in person with interrpreter - and anatomy US.  Natale Milch MD [facility], [facility] 03/26/2020, 3:51 PM

## 2020-03-27 LAB — URINE CYTOLOGY ANCILLARY ONLY
Chlamydia: NEGATIVE
Comment: NEGATIVE
Comment: NORMAL
Neisseria Gonorrhea: NEGATIVE

## 2020-04-24 ENCOUNTER — Ambulatory Visit: Payer: 59

## 2020-04-24 ENCOUNTER — Encounter: Payer: 59 | Admitting: Obstetrics

## 2020-04-24 DIAGNOSIS — Z3480 Encounter for supervision of other normal pregnancy, unspecified trimester: Secondary | ICD-10-CM

## 2020-05-03 ENCOUNTER — Ambulatory Visit
Admission: RE | Admit: 2020-05-03 | Discharge: 2020-05-03 | Disposition: A | Discharge status: Home / Self Care | Payer: 59 | Source: Ambulatory Visit | Attending: Obstetrics and Gynecology | Admitting: Obstetrics and Gynecology

## 2020-05-03 ENCOUNTER — Other Ambulatory Visit: Payer: Self-pay

## 2020-05-03 DIAGNOSIS — Z3480 Encounter for supervision of other normal pregnancy, unspecified trimester: Secondary | ICD-10-CM | POA: Insufficient documentation

## 2020-05-07 ENCOUNTER — Encounter: Payer: 59 | Admitting: Advanced Practice Midwife

## 2020-05-08 ENCOUNTER — Ambulatory Visit (INDEPENDENT_AMBULATORY_CARE_PROVIDER_SITE_OTHER): Payer: 59 | Admitting: Obstetrics and Gynecology

## 2020-05-08 ENCOUNTER — Other Ambulatory Visit: Payer: Self-pay

## 2020-05-08 VITALS — BP 112/70 | Ht 65.0 in | Wt 138.8 lb

## 2020-05-08 DIAGNOSIS — Z3A21 21 weeks gestation of pregnancy: Secondary | ICD-10-CM

## 2020-05-08 DIAGNOSIS — O2342 Unspecified infection of urinary tract in pregnancy, second trimester: Secondary | ICD-10-CM

## 2020-05-08 DIAGNOSIS — Z3480 Encounter for supervision of other normal pregnancy, unspecified trimester: Secondary | ICD-10-CM

## 2020-05-08 LAB — POCT URINALYSIS DIPSTICK OB
Glucose, UA: NEGATIVE
POC,PROTEIN,UA: NEGATIVE

## 2020-05-08 NOTE — Progress Notes (Signed)
    Routine Prenatal Care Visit  Subjective  Stacie Garrison is a 35 y.o. G3P1011 at [redacted]w[redacted]d being seen today for ongoing prenatal care.  She is currently monitored for the following issues for this low-risk pregnancy and has COVID-19 affecting pregnancy in first trimester and Supervision of other normal pregnancy, antepartum on their problem list.  ----------------------------------------------------------------------------------- Patient reports no complaints.   Contractions: Not present. Vag. Bleeding: None.  Movement: Present. Denies leaking of fluid.  ----------------------------------------------------------------------------------- The following portions of the patient's history were reviewed and updated as appropriate: allergies, current medications, past family history, past medical history, past social history, past surgical history and problem list. Problem list updated.   Objective  Blood pressure 112/70, height 5\' 5"  (1.651 m), weight 138 lb 12.8 oz (63 kg), last menstrual period 12/12/2019, unknown if currently breastfeeding. Pregravid weight Pregravid weight not on file Total Weight Gain Not found. Urinalysis:      Fetal Status: Fetal Heart Rate (bpm): 140 Fundal Height: 20 cm Movement: Present     General:  Alert, oriented and cooperative. Patient is in no acute distress.  Skin: Skin is warm and dry. No rash noted.   Cardiovascular: Normal heart rate noted  Respiratory: Normal respiratory effort, no problems with respiration noted  Abdomen: Soft, gravid, appropriate for gestational age. Pain/Pressure: Absent     Pelvic:  Cervical exam deferred        Extremities: Normal range of motion.  Edema: None  Mental Status: Normal mood and affect. Normal behavior. Normal judgment and thought content.     Assessment   35 y.o. G3P1011 at [redacted]w[redacted]d by  09/17/2020, by Last Menstrual Period presenting for routine prenatal visit  Plan   G2 Problems (from 02/27/20 to present)    Problem  Noted Resolved   Supervision of other normal pregnancy, antepartum 02/28/2020 by 04/27/2020, MD No   Overview Addendum 05/08/2020 10:15 AM by 05/10/2020, MD     Nursing Staff Provider  Office Location  [facility]ating   LMP = 11 Natale Milch  Language  [place] Anatomy [place]  completed  Flu Vaccine   Genetic Screen  NIPS: normal XY  TDaP vaccine    Hgb A1C or  GTT Early : Third trimester :   Covid Had in 1st trimester   LAB RESULTS   Rhogam   not needed Blood Type O/Positive/-- (02/07 1114)   Feeding Plan  Antibody Negative (02/07 1114)  Contraception  Rubella 20.60 (02/07 1114)  Circumcision  RPR Non Reactive (02/07 1114)   Pediatrician   HBsAg Negative (02/07 1114)   Support Person  HIV Non Reactive (02/07 1114)  Prenatal Classes  Varicella  immune    GBS  (For PCN allergy, check sensitivities)   BTL Consent     VBAC Consent  Pap  NIL    Hgb Electro   negative    CF  negative     SMA  negative              Previous Version       Checking urine culture to ensure resolution of prior infection Reviewed normal anatomy scan  Gestational age appropriate obstetric precautions including but not limited to vaginal bleeding, contractions, leaking of fluid and fetal movement were reviewed in detail with the patient.    Return in about 3 weeks (around 05/29/2020) for ROB in person.  07/29/2020 MD [facility], [facility] 05/08/2020, 10:25 AM

## 2020-05-11 ENCOUNTER — Other Ambulatory Visit: Payer: Self-pay | Admitting: Obstetrics and Gynecology

## 2020-05-11 DIAGNOSIS — N3000 Acute cystitis without hematuria: Secondary | ICD-10-CM

## 2020-05-11 LAB — URINE CULTURE

## 2020-05-11 MED ORDER — NITROFURANTOIN MONOHYD MACRO 100 MG PO CAPS: 100.0000 mg | ORAL_CAPSULE | Freq: Two times a day (BID) | ORAL | 0 refills | Status: DC

## 2020-05-29 ENCOUNTER — Other Ambulatory Visit: Payer: Self-pay

## 2020-05-29 ENCOUNTER — Encounter: Payer: Self-pay | Admitting: Obstetrics and Gynecology

## 2020-05-29 ENCOUNTER — Ambulatory Visit (INDEPENDENT_AMBULATORY_CARE_PROVIDER_SITE_OTHER): Payer: 59 | Admitting: Obstetrics and Gynecology

## 2020-05-29 VITALS — BP 110/74 | Wt 144.0 lb

## 2020-05-29 DIAGNOSIS — Z3A24 24 weeks gestation of pregnancy: Secondary | ICD-10-CM

## 2020-05-29 DIAGNOSIS — U071 COVID-19: Secondary | ICD-10-CM

## 2020-05-29 DIAGNOSIS — Z113 Encounter for screening for infections with a predominantly sexual mode of transmission: Secondary | ICD-10-CM

## 2020-05-29 DIAGNOSIS — Z3482 Encounter for supervision of other normal pregnancy, second trimester: Secondary | ICD-10-CM

## 2020-05-29 DIAGNOSIS — O98511 Other viral diseases complicating pregnancy, first trimester: Secondary | ICD-10-CM

## 2020-05-29 DIAGNOSIS — Z131 Encounter for screening for diabetes mellitus: Secondary | ICD-10-CM

## 2020-05-29 NOTE — Progress Notes (Signed)
Routine Prenatal Care Visit  Subjective  Stacie Garrison is a 35 y.o. G3P1011 at [redacted]w[redacted]d being seen today for ongoing prenatal care.  She is currently monitored for the following issues for this low-risk pregnancy and has COVID-19 affecting pregnancy in first trimester and Supervision of other normal pregnancy, antepartum on their problem list.  ----------------------------------------------------------------------------------- Patient reports no complaints.   Contractions: Not present. Vag. Bleeding: None.  Movement: Present. Leaking Fluid denies.  ----------------------------------------------------------------------------------- The following portions of the patient's history were reviewed and updated as appropriate: allergies, current medications, past family history, past medical history, past social history, past surgical history and problem list. Problem list updated.  Objective  Blood pressure 110/74, weight 144 lb (65.3 kg), last menstrual period 12/12/2019, unknown if currently breastfeeding. Pregravid weight 129 lb (58.5 kg) Total Weight Gain 15 lb (6.804 kg) Urinalysis: Urine Protein    Urine Glucose    Fetal Status: Fetal Heart Rate (bpm): 150 Fundal Height: 24 cm Movement: Present     General:  Alert, oriented and cooperative. Patient is in no acute distress.  Skin: Skin is warm and dry. No rash noted.   Cardiovascular: Normal heart rate noted  Respiratory: Normal respiratory effort, no problems with respiration noted  Abdomen: Soft, gravid, appropriate for gestational age. Pain/Pressure: Absent     Pelvic:  Cervical exam deferred        Extremities: Normal range of motion.  Edema: None  Mental Status: Normal mood and affect. Normal behavior. Normal judgment and thought content.   Assessment   35 y.o. G3P1011 at [redacted]w[redacted]d by  09/17/2020, by Last Menstrual Period presenting for routine prenatal visit  Plan   G2 Problems (from 02/27/20 to present)    Problem Noted Resolved    Supervision of other normal pregnancy, antepartum 02/28/2020 by Vena Austria, MD No   Overview Addendum 05/08/2020 10:25 AM by Natale Milch, MD     Nursing Staff Provider  Office Location  [facility]   LMP = 11 [place]  Language  [place] Anatomy US  completed  Flu Vaccine   Genetic Screen  NIPS: normal XY  TDaP vaccine    Hgb A1C or  GTT  Third trimester :   Covid Had in first trimester of pregnancy   LAB RESULTS   Rhogam   not needed Blood Type O/Positive/-- (02/07 1114)   Feeding Plan  Antibody Negative (02/07 1114)  Contraception  Rubella 20.60 (02/07 1114)  Circumcision  RPR Non Reactive (02/07 1114)   Pediatrician   HBsAg Negative (02/07 1114)   Support Person  HIV Non Reactive (02/07 1114)  Prenatal Classes  Varicella  immune    GBS  (For PCN allergy, check sensitivities)   BTL Consent     VBAC Consent  Pap  NIL    Hgb Electro   negative    CF  negative     SMA  negative              Previous Version       Preterm labor symptoms and general obstetric precautions including but not limited to vaginal bleeding, contractions, leaking of fluid and fetal movement were reviewed in detail with the patient. Please refer to After Visit Summary for other counseling recommendations.   - patient states she took abx at end of April for UTI.  TOC at next visit. No sx today.  [place] medical interpreter with patient today "Inetta Fermo."  Return in about 4 weeks (around 06/26/2020) for Routine Prenatal Appointment w 28  week labs.   Thomasene Mohair, MD, [facility], [facility] 05/29/2020 9:30 AM

## 2020-07-02 ENCOUNTER — Other Ambulatory Visit: Payer: Self-pay

## 2020-07-02 ENCOUNTER — Encounter: Payer: Self-pay | Admitting: Obstetrics and Gynecology

## 2020-07-02 ENCOUNTER — Other Ambulatory Visit: Payer: 59

## 2020-07-02 ENCOUNTER — Ambulatory Visit (INDEPENDENT_AMBULATORY_CARE_PROVIDER_SITE_OTHER): Payer: 59 | Admitting: Obstetrics and Gynecology

## 2020-07-02 VITALS — BP 100/60 | Wt 151.0 lb

## 2020-07-02 DIAGNOSIS — Z3483 Encounter for supervision of other normal pregnancy, third trimester: Secondary | ICD-10-CM

## 2020-07-02 DIAGNOSIS — O2342 Unspecified infection of urinary tract in pregnancy, second trimester: Secondary | ICD-10-CM

## 2020-07-02 DIAGNOSIS — Z3482 Encounter for supervision of other normal pregnancy, second trimester: Secondary | ICD-10-CM

## 2020-07-02 DIAGNOSIS — U071 COVID-19: Secondary | ICD-10-CM

## 2020-07-02 DIAGNOSIS — O98511 Other viral diseases complicating pregnancy, first trimester: Secondary | ICD-10-CM

## 2020-07-02 DIAGNOSIS — Z3A29 29 weeks gestation of pregnancy: Secondary | ICD-10-CM

## 2020-07-02 DIAGNOSIS — Z131 Encounter for screening for diabetes mellitus: Secondary | ICD-10-CM

## 2020-07-02 DIAGNOSIS — Z113 Encounter for screening for infections with a predominantly sexual mode of transmission: Secondary | ICD-10-CM

## 2020-07-02 LAB — POCT URINALYSIS DIPSTICK OB
Glucose, UA: NEGATIVE
POC,PROTEIN,UA: NEGATIVE

## 2020-07-02 NOTE — Addendum Note (Signed)
Addended by: Donnetta Hail on: 07/02/2020 09:38 AM   Modules accepted: Orders

## 2020-07-02 NOTE — Progress Notes (Signed)
  Routine Prenatal Care Visit  Subjective  Stacie Garrison is a 35 y.o. G3P1011 at [redacted]w[redacted]d being seen today for ongoing prenatal care.  She is currently monitored for the following issues for this low-risk pregnancy and has COVID-19 affecting pregnancy in first trimester and Supervision of other normal pregnancy, antepartum on their problem list.  ----------------------------------------------------------------------------------- Patient reports no complaints.   Contractions: Not present. Vag. Bleeding: None.  Movement: Present. Leaking Fluid denies.  ----------------------------------------------------------------------------------- The following portions of the patient's history were reviewed and updated as appropriate: allergies, current medications, past family history, past medical history, past social history, past surgical history and problem list. Problem list updated.  Objective  Blood pressure 100/60, weight 151 lb (68.5 kg), last menstrual period 12/12/2019, unknown if currently breastfeeding. Pregravid weight 129 lb (58.5 kg) Total Weight Gain 22 lb (9.979 kg) Urinalysis: Urine Protein    Urine Glucose    Fetal Status: Fetal Heart Rate (bpm): 140 Fundal Height: 29 cm Movement: Present     General:  Alert, oriented and cooperative. Patient is in no acute distress.  Skin: Skin is warm and dry. No rash noted.   Cardiovascular: Normal heart rate noted  Respiratory: Normal respiratory effort, no problems with respiration noted  Abdomen: Soft, gravid, appropriate for gestational age. Pain/Pressure: Absent     Pelvic:  Cervical exam deferred        Extremities: Normal range of motion.  Edema: None  Mental Status: Normal mood and affect. Normal behavior. Normal judgment and thought content.   Assessment   35 y.o. G3P1011 at [redacted]w[redacted]d by  09/17/2020, by Last Menstrual Period presenting for routine prenatal visit  Plan   G2 Problems (from 02/27/20 to present)     Problem Noted Resolved    Supervision of other normal pregnancy, antepartum 02/28/2020 by Vena Austria, MD No   Overview Addendum 05/08/2020 10:25 AM by Natale Milch, MD     Nursing Staff Provider  Office Location  [facility]   LMP = 11 [place]  Language  [place] Anatomy US  completed  Flu Vaccine   Genetic Screen  NIPS: normal XY  TDaP vaccine    Hgb A1C or  GTT  Third trimester :   Covid Had in first trimester of pregnancy   LAB RESULTS   Rhogam   not needed Blood Type O/Positive/-- (02/07 1114)   Feeding Plan  Antibody Negative (02/07 1114)  Contraception  Rubella 20.60 (02/07 1114)  Circumcision  RPR Non Reactive (02/07 1114)   Pediatrician   HBsAg Negative (02/07 1114)   Support Person  HIV Non Reactive (02/07 1114)  Prenatal Classes  Varicella  immune    GBS  (For PCN allergy, check sensitivities)   BTL Consent     VBAC Consent  Pap  NIL    Hgb Electro   negative    CF  negative     SMA  negative                   Preterm labor symptoms and general obstetric precautions including but not limited to vaginal bleeding, contractions, leaking of fluid and fetal movement were reviewed in detail with the patient. Please refer to After Visit Summary for other counseling recommendations.   - UCx for TOC today  Return in about 2 weeks (around 07/16/2020) for Routine Prenatal Appointment.   Thomasene Mohair, MD, [facility], [facility] 07/02/2020 9:18 AM

## 2020-07-03 LAB — 28 WEEK RH+PANEL
Basophils Absolute: 0 10*3/uL (ref 0.0–0.2)
Basos: 0 %
EOS (ABSOLUTE): 0.1 10*3/uL (ref 0.0–0.4)
Eos: 1 %
Gestational Diabetes Screen: 113 mg/dL (ref 65–139)
HIV Screen 4th Generation wRfx: NONREACTIVE
Hematocrit: 28.9 % — ABNORMAL LOW (ref 34.0–46.6)
Hemoglobin: 9.5 g/dL — ABNORMAL LOW (ref 11.1–15.9)
Immature Grans (Abs): 0.1 10*3/uL (ref 0.0–0.1)
Immature Granulocytes: 1 %
Lymphocytes Absolute: 1.7 10*3/uL (ref 0.7–3.1)
Lymphs: 16 %
MCH: 27.6 pg (ref 26.6–33.0)
MCHC: 32.9 g/dL (ref 31.5–35.7)
MCV: 84 fL (ref 79–97)
Monocytes Absolute: 0.7 10*3/uL (ref 0.1–0.9)
Monocytes: 7 %
Neutrophils Absolute: 7.6 10*3/uL — ABNORMAL HIGH (ref 1.4–7.0)
Neutrophils: 75 %
Platelets: 154 10*3/uL (ref 150–450)
RBC: 3.44 x10E6/uL — ABNORMAL LOW (ref 3.77–5.28)
RDW: 13 % (ref 11.7–15.4)
RPR Ser Ql: NONREACTIVE
WBC: 10.2 10*3/uL (ref 3.4–10.8)

## 2020-07-04 LAB — URINE CULTURE

## 2020-07-18 ENCOUNTER — Other Ambulatory Visit: Payer: Self-pay

## 2020-07-18 ENCOUNTER — Ambulatory Visit (INDEPENDENT_AMBULATORY_CARE_PROVIDER_SITE_OTHER): Payer: 59 | Admitting: Obstetrics and Gynecology

## 2020-07-18 ENCOUNTER — Encounter: Payer: Self-pay | Admitting: Obstetrics and Gynecology

## 2020-07-18 VITALS — BP 98/58 | Wt 150.0 lb

## 2020-07-18 DIAGNOSIS — Z3483 Encounter for supervision of other normal pregnancy, third trimester: Secondary | ICD-10-CM

## 2020-07-18 DIAGNOSIS — Z3A31 31 weeks gestation of pregnancy: Secondary | ICD-10-CM

## 2020-07-18 DIAGNOSIS — Z3480 Encounter for supervision of other normal pregnancy, unspecified trimester: Secondary | ICD-10-CM

## 2020-07-18 DIAGNOSIS — O98511 Other viral diseases complicating pregnancy, first trimester: Secondary | ICD-10-CM

## 2020-07-18 DIAGNOSIS — U071 COVID-19: Secondary | ICD-10-CM

## 2020-07-18 DIAGNOSIS — Z23 Encounter for immunization: Secondary | ICD-10-CM

## 2020-07-18 LAB — POCT URINALYSIS DIPSTICK OB
Glucose, UA: NEGATIVE
POC,PROTEIN,UA: NEGATIVE

## 2020-07-18 NOTE — Progress Notes (Signed)
  Routine Prenatal Care Visit  Subjective  Stacie Garrison is a 35 y.o. G3P1011 at [redacted]w[redacted]d being seen today for ongoing prenatal care.  She is currently monitored for the following issues for this low-risk pregnancy and has COVID-19 affecting pregnancy in first trimester and Supervision of other normal pregnancy, antepartum on their problem list.  ----------------------------------------------------------------------------------- Patient reports no complaints.   Contractions: Not present. Vag. Bleeding: None.  Movement: Present. Leaking Fluid denies.  ----------------------------------------------------------------------------------- The following portions of the patient's history were reviewed and updated as appropriate: allergies, current medications, past family history, past medical history, past social history, past surgical history and problem list. Problem list updated.  Objective  Blood pressure (!) 98/58, weight 150 lb (68 kg), last menstrual period 12/12/2019, unknown if currently breastfeeding. Pregravid weight 129 lb (58.5 kg) Total Weight Gain 21 lb (9.526 kg) Urinalysis: Urine Protein Negative  Urine Glucose Negative  Fetal Status: Fetal Heart Rate (bpm): 140 Fundal Height: 31 cm Movement: Present     General:  Alert, oriented and cooperative. Patient is in no acute distress.  Skin: Skin is warm and dry. No rash noted.   Cardiovascular: Normal heart rate noted  Respiratory: Normal respiratory effort, no problems with respiration noted  Abdomen: Soft, gravid, appropriate for gestational age. Pain/Pressure: Absent     Pelvic:  Cervical exam deferred        Extremities: Normal range of motion.  Edema: None  Mental Status: Normal mood and affect. Normal behavior. Normal judgment and thought content.   Assessment   35 y.o. G3P1011 at [redacted]w[redacted]d by  09/17/2020, by Last Menstrual Period presenting for routine prenatal visit  Plan   G2 Problems (from 02/27/20 to present)     Problem  Noted Resolved   Supervision of other normal pregnancy, antepartum 02/28/2020 by Vena Austria, MD No   Overview Addendum 05/08/2020 10:25 AM by Natale Milch, MD     Nursing Staff Provider  Office Location  [facility]   LMP = 11 [place]  Language  [place] Anatomy US  completed  Flu Vaccine   Genetic Screen  NIPS: normal XY  TDaP vaccine    Hgb A1C or  GTT  Third trimester :   Covid Had in first trimester of pregnancy   LAB RESULTS   Rhogam   not needed Blood Type O/Positive/-- (02/07 1114)   Feeding Plan  Antibody Negative (02/07 1114)  Contraception  Rubella 20.60 (02/07 1114)  Circumcision  RPR Non Reactive (02/07 1114)   Pediatrician   HBsAg Negative (02/07 1114)   Support Person  HIV Non Reactive (02/07 1114)  Prenatal Classes  Varicella  immune    GBS  (For PCN allergy, check sensitivities)   BTL Consent     VBAC Consent  Pap  NIL    Hgb Electro   negative    CF  negative     SMA  negative                   Preterm labor symptoms and general obstetric precautions including but not limited to vaginal bleeding, contractions, leaking of fluid and fetal movement were reviewed in detail with the patient. Please refer to After Visit Summary for other counseling recommendations.   TDaP today Considered signing blood tx consent  Return in about 2 weeks (around 08/01/2020) for Routine Prenatal Appointment.   Thomasene Mohair, MD, [facility], [facility] 07/18/2020 8:35 AM

## 2020-07-18 NOTE — Patient Instructions (Signed)
For anemia (low blood counts) take: Iron sulfate 325 mg pills two times each day with meals (one at breakfast or lunch and one with supper). These can be bought at any pharmacy ([facility], [facility], etc[facility] or at [facility] or [employer].

## 2020-08-03 ENCOUNTER — Encounter: Payer: Self-pay | Admitting: Obstetrics and Gynecology

## 2020-08-03 ENCOUNTER — Ambulatory Visit (INDEPENDENT_AMBULATORY_CARE_PROVIDER_SITE_OTHER): Payer: 59 | Admitting: Obstetrics and Gynecology

## 2020-08-03 ENCOUNTER — Other Ambulatory Visit: Payer: Self-pay

## 2020-08-03 VITALS — BP 120/80 | Wt 156.0 lb

## 2020-08-03 DIAGNOSIS — Z3A33 33 weeks gestation of pregnancy: Secondary | ICD-10-CM

## 2020-08-03 DIAGNOSIS — U071 COVID-19: Secondary | ICD-10-CM

## 2020-08-03 DIAGNOSIS — O98511 Other viral diseases complicating pregnancy, first trimester: Secondary | ICD-10-CM

## 2020-08-03 DIAGNOSIS — Z3483 Encounter for supervision of other normal pregnancy, third trimester: Secondary | ICD-10-CM

## 2020-08-03 LAB — POCT URINALYSIS DIPSTICK OB
Glucose, UA: NEGATIVE
POC,PROTEIN,UA: NEGATIVE

## 2020-08-03 NOTE — Addendum Note (Signed)
Addended by: Cornelius Moras D on: 08/03/2020 02:22 PM   Modules accepted: Orders

## 2020-08-03 NOTE — Progress Notes (Signed)
  Routine Prenatal Care Visit  Subjective  Stacie Garrison is a 35 y.o. G3P1011 at [redacted]w[redacted]d being seen today for ongoing prenatal care.  She is currently monitored for the following issues for this low-risk pregnancy and has COVID-19 affecting pregnancy in first trimester and Supervision of other normal pregnancy, antepartum on their problem list.  ----------------------------------------------------------------------------------- Patient reports no complaints.   Contractions: Not present. Vag. Bleeding: None.  Movement: Present. Leaking Fluid denies.  ----------------------------------------------------------------------------------- The following portions of the patient's history were reviewed and updated as appropriate: allergies, current medications, past family history, past medical history, past social history, past surgical history and problem list. Problem list updated.  Objective  Blood pressure 120/80, weight 156 lb (70.8 kg), last menstrual period 12/12/2019, unknown if currently breastfeeding. Pregravid weight 129 lb (58.5 kg) Total Weight Gain 27 lb (12.2 kg) Urinalysis: Urine Protein    Urine Glucose    Fetal Status: Fetal Heart Rate (bpm): 145 Fundal Height: 33 cm Movement: Present     General:  Alert, oriented and cooperative. Patient is in no acute distress.  Skin: Skin is warm and dry. No rash noted.   Cardiovascular: Normal heart rate noted  Respiratory: Normal respiratory effort, no problems with respiration noted  Abdomen: Soft, gravid, appropriate for gestational age. Pain/Pressure: Absent     Pelvic:  Cervical exam deferred        Extremities: Normal range of motion.  Edema: None  Mental Status: Normal mood and affect. Normal behavior. Normal judgment and thought content.   Assessment   35 y.o. G3P1011 at [redacted]w[redacted]d by  09/17/2020, by Last Menstrual Period presenting for routine prenatal visit  Plan   G2 Problems (from 02/27/20 to present)     Problem Noted Resolved    Supervision of other normal pregnancy, antepartum 02/28/2020 by Vena Austria, MD No   Overview Addendum 05/08/2020 10:25 AM by Natale Milch, MD     Nursing Staff Provider  Office Location  [facility]   LMP = 11 [place]  Language  [place] Anatomy US  completed  Flu Vaccine   Genetic Screen  NIPS: normal XY  TDaP vaccine    Hgb A1C or  GTT  Third trimester :   Covid Had in first trimester of pregnancy   LAB RESULTS   Rhogam   not needed Blood Type O/Positive/-- (02/07 1114)   Feeding Plan  Antibody Negative (02/07 1114)  Contraception  Rubella 20.60 (02/07 1114)  Circumcision  RPR Non Reactive (02/07 1114)   Pediatrician   HBsAg Negative (02/07 1114)   Support Person  HIV Non Reactive (02/07 1114)  Prenatal Classes  Varicella  immune    GBS  (For PCN allergy, check sensitivities)   BTL Consent     VBAC Consent  Pap  NIL    Hgb Electro   negative    CF  negative     SMA  negative                  Preterm labor symptoms and general obstetric precautions including but not limited to vaginal bleeding, contractions, leaking of fluid and fetal movement were reviewed in detail with the patient. Please refer to After Visit Summary for other counseling recommendations.   Return in about 2 weeks (around 08/17/2020) for Routine Prenatal Appointment.   Thomasene Mohair, MD, [facility], [facility] 08/03/2020 2:16 PM

## 2020-08-21 ENCOUNTER — Encounter: Payer: Self-pay | Admitting: Obstetrics and Gynecology

## 2020-08-21 ENCOUNTER — Other Ambulatory Visit: Payer: Self-pay

## 2020-08-21 ENCOUNTER — Ambulatory Visit (INDEPENDENT_AMBULATORY_CARE_PROVIDER_SITE_OTHER): Payer: 59 | Admitting: Obstetrics and Gynecology

## 2020-08-21 ENCOUNTER — Other Ambulatory Visit (HOSPITAL_COMMUNITY)
Admission: RE | Admit: 2020-08-21 | Discharge: 2020-08-21 | Disposition: A | Discharge status: Home / Self Care | Payer: 59 | Source: Ambulatory Visit | Attending: Obstetrics and Gynecology | Admitting: Obstetrics and Gynecology

## 2020-08-21 VITALS — BP 114/68

## 2020-08-21 DIAGNOSIS — Z3483 Encounter for supervision of other normal pregnancy, third trimester: Secondary | ICD-10-CM | POA: Diagnosis present

## 2020-08-21 DIAGNOSIS — Z3A36 36 weeks gestation of pregnancy: Secondary | ICD-10-CM | POA: Insufficient documentation

## 2020-08-21 DIAGNOSIS — U071 COVID-19: Secondary | ICD-10-CM

## 2020-08-21 DIAGNOSIS — O98511 Other viral diseases complicating pregnancy, first trimester: Secondary | ICD-10-CM

## 2020-08-21 LAB — POCT URINALYSIS DIPSTICK OB
Glucose, UA: NEGATIVE
POC,PROTEIN,UA: NEGATIVE

## 2020-08-21 NOTE — Progress Notes (Signed)
  Routine Prenatal Care Visit  Subjective  Stacie Garrison is a 35 y.o. G3P1011 at [redacted]w[redacted]d being seen today for ongoing prenatal care.  She is currently monitored for the following issues for this low-risk pregnancy and has COVID-19 affecting pregnancy in first trimester and Supervision of other normal pregnancy, antepartum on their problem list.  ----------------------------------------------------------------------------------- Patient reports no complaints.   Contractions: Not present. Vag. Bleeding: None.  Movement: Present. Leaking Fluid denies.  ----------------------------------------------------------------------------------- The following portions of the patient's history were reviewed and updated as appropriate: allergies, current medications, past family history, past medical history, past social history, past surgical history and problem list. Problem list updated.  Objective  Blood pressure 114/68, last menstrual period 12/12/2019, unknown if currently breastfeeding. Pregravid weight 129 lb (58.5 kg) Total Weight Gain 27 lb (12.2 kg) Urinalysis: Urine Protein Negative  Urine Glucose Negative  Fetal Status: Fetal Heart Rate (bpm): 145 Fundal Height: 36 cm Movement: Present  Presentation: Vertex  General:  Alert, oriented and cooperative. Patient is in no acute distress.  Skin: Skin is warm and dry. No rash noted.   Cardiovascular: Normal heart rate noted  Respiratory: Normal respiratory effort, no problems with respiration noted  Abdomen: Soft, gravid, appropriate for gestational age. Pain/Pressure: Present     Pelvic:  Cervical exam deferred, GBS/aptima collected today        Extremities: Normal range of motion.  Edema: None  Mental Status: Normal mood and affect. Normal behavior. Normal judgment and thought content.   Female chaperone present for pelvic exam:   Assessment   35 y.o. G3P1011 at [redacted]w[redacted]d by  09/17/2020, by Last Menstrual Period presenting for routine prenatal  visit  Plan   G2 Problems (from 02/27/20 to present)     Problem Noted Resolved   Supervision of other normal pregnancy, antepartum 02/28/2020 by Vena Austria, MD No   Overview Addendum 05/08/2020 10:25 AM by Natale Milch, MD     Nursing Staff Provider  Office Location  [facility] Dating   LMP = 11 [place]  Language  [place] Anatomy US  completed  Flu Vaccine   Genetic Screen  NIPS: normal XY  TDaP vaccine    Hgb A1C or  GTT  Third trimester :   Covid Had in first trimester of pregnancy   LAB RESULTS   Rhogam   not needed Blood Type O/Positive/-- (02/07 1114)   Feeding Plan  Antibody Negative (02/07 1114)  Contraception  Rubella 20.60 (02/07 1114)  Circumcision  RPR Non Reactive (02/07 1114)   Pediatrician   HBsAg Negative (02/07 1114)   Support Person  HIV Non Reactive (02/07 1114)  Prenatal Classes  Varicella  immune    GBS  (For PCN allergy, check sensitivities)   BTL Consent     VBAC Consent  Pap  NIL    Hgb Electro   negative    CF  negative     SMA  negative                  Preterm labor symptoms and general obstetric precautions including but not limited to vaginal bleeding, contractions, leaking of fluid and fetal movement were reviewed in detail with the patient. Please refer to After Visit Summary for other counseling recommendations.   - GBS/Aptima today  Return in about 1 week (around 08/28/2020) for Routine Prenatal Appointment (may make multiple weekly appts).   Thomasene Mohair, MD, [facility], [facility] 08/21/2020 2:03 PM

## 2020-08-23 LAB — CERVICOVAGINAL ANCILLARY ONLY
Chlamydia: NEGATIVE
Comment: NEGATIVE
Comment: NORMAL
Neisseria Gonorrhea: NEGATIVE

## 2020-08-23 LAB — STREP GP B NAA: Strep Gp B NAA: POSITIVE — AB

## 2020-08-29 ENCOUNTER — Ambulatory Visit (INDEPENDENT_AMBULATORY_CARE_PROVIDER_SITE_OTHER): Payer: 59 | Admitting: Advanced Practice Midwife

## 2020-08-29 ENCOUNTER — Other Ambulatory Visit: Payer: Self-pay

## 2020-08-29 ENCOUNTER — Encounter: Payer: Self-pay | Admitting: Advanced Practice Midwife

## 2020-08-29 VITALS — BP 102/61 | Wt 157.0 lb

## 2020-08-29 DIAGNOSIS — Z3A37 37 weeks gestation of pregnancy: Secondary | ICD-10-CM

## 2020-08-29 DIAGNOSIS — Z3483 Encounter for supervision of other normal pregnancy, third trimester: Secondary | ICD-10-CM

## 2020-08-29 LAB — POCT URINALYSIS DIPSTICK OB
Glucose, UA: NEGATIVE
POC,PROTEIN,UA: NEGATIVE

## 2020-08-29 NOTE — Progress Notes (Signed)
ROB - no concerns. Procedure RM  

## 2020-08-29 NOTE — Progress Notes (Signed)
  Routine Prenatal Care Visit  Subjective  Stacie Garrison is a 35 y.o. G3P1011 at [redacted]w[redacted]d being seen today for ongoing prenatal care.  She is currently monitored for the following issues for this low-risk pregnancy and has COVID-19 affecting pregnancy in first trimester and Supervision of other normal pregnancy, antepartum on their problem list.  ----------------------------------------------------------------------------------- Patient reports  numbness in her hands .   Contractions: Not present. Vag. Bleeding: None.  Movement: Present. Leaking Fluid denies.  ----------------------------------------------------------------------------------- The following portions of the patient's history were reviewed and updated as appropriate: allergies, current medications, past family history, past medical history, past social history, past surgical history and problem list. Problem list updated.  Objective  Blood pressure 102/61, weight 157 lb (71.2 kg), last menstrual period 12/12/2019, unknown if currently breastfeeding. Pregravid weight 129 lb (58.5 kg) Total Weight Gain 28 lb (12.7 kg) Urinalysis: Urine Protein Negative  Urine Glucose Negative  Fetal Status: Fetal Heart Rate (bpm): 138 Fundal Height: 37 cm Movement: Present     General:  Alert, oriented and cooperative. Patient is in no acute distress.  Skin: Skin is warm and dry. No rash noted.   Cardiovascular: Normal heart rate noted  Respiratory: Normal respiratory effort, no problems with respiration noted  Abdomen: Soft, gravid, appropriate for gestational age. Pain/Pressure: Absent     Pelvic:  Cervical exam deferred        Extremities: Normal range of motion.  Edema: None  Mental Status: Normal mood and affect. Normal behavior. Normal judgment and thought content.   Assessment   35 y.o. G3P1011 at [redacted]w[redacted]d by  09/17/2020, by Last Menstrual Period presenting for routine prenatal visit  Plan   G2 Problems (from 02/27/20 to present)      Problem Noted Resolved   Supervision of other normal pregnancy, antepartum 02/28/2020 by Vena Austria, MD No   Overview Addendum 05/08/2020 10:25 AM by Natale Milch, MD     Nursing Staff Provider  Office Location  [facility] Dating   LMP = 11 [place]  Language  [place] Anatomy US  completed  Flu Vaccine   Genetic Screen  NIPS: normal XY  TDaP vaccine    Hgb A1C or  GTT  Third trimester :   Covid Had in first trimester of pregnancy   LAB RESULTS   Rhogam   not needed Blood Type O/Positive/-- (02/07 1114)   Feeding Plan  Antibody Negative (02/07 1114)  Contraception Paragard Rubella 20.60 (02/07 1114)  Circumcision  RPR Non Reactive (02/07 1114)   Pediatrician   HBsAg Negative (02/07 1114)   Support Person  HIV Non Reactive (02/07 1114)  Prenatal Classes  Varicella  immune    GBS  (For PCN allergy, check sensitivities)   BTL Consent     VBAC Consent  Pap  NIL    Hgb Electro   negative    CF  negative     SMA  negative                   Term labor symptoms and general obstetric precautions including but not limited to vaginal bleeding, contractions, leaking of fluid and fetal movement were reviewed in detail with the patient. Please refer to After Visit Summary for other counseling recommendations.   Return for scheduled appointment.  Tresea Mall, CNM 08/29/2020 9:28 AM

## 2020-08-31 ENCOUNTER — Telehealth: Payer: Self-pay

## 2020-08-31 ENCOUNTER — Other Ambulatory Visit: Payer: Self-pay

## 2020-08-31 ENCOUNTER — Ambulatory Visit: Payer: 59 | Admitting: Obstetrics & Gynecology

## 2020-08-31 ENCOUNTER — Inpatient Hospital Stay
Admission: EM | Admit: 2020-08-31 | Discharge: 2020-09-02 | DRG: 807 | Disposition: A | Discharge status: Home / Self Care | Payer: 59 | Attending: Obstetrics & Gynecology | Admitting: Obstetrics & Gynecology

## 2020-08-31 ENCOUNTER — Encounter: Payer: Self-pay | Admitting: Obstetrics

## 2020-08-31 VITALS — BP 100/60 | Wt 157.0 lb

## 2020-08-31 DIAGNOSIS — Z8616 Personal history of COVID-19: Secondary | ICD-10-CM

## 2020-08-31 DIAGNOSIS — Z3A37 37 weeks gestation of pregnancy: Secondary | ICD-10-CM | POA: Diagnosis not present

## 2020-08-31 DIAGNOSIS — O4292 Full-term premature rupture of membranes, unspecified as to length of time between rupture and onset of labor: Secondary | ICD-10-CM | POA: Diagnosis present

## 2020-08-31 DIAGNOSIS — O99824 Streptococcus B carrier state complicating childbirth: Secondary | ICD-10-CM | POA: Diagnosis present

## 2020-08-31 DIAGNOSIS — O4290 Premature rupture of membranes, unspecified as to length of time between rupture and onset of labor, unspecified weeks of gestation: Secondary | ICD-10-CM

## 2020-08-31 DIAGNOSIS — Z3480 Encounter for supervision of other normal pregnancy, unspecified trimester: Secondary | ICD-10-CM

## 2020-08-31 LAB — CBC
HCT: 35.9 % — ABNORMAL LOW (ref 36.0–46.0)
Hemoglobin: 12.4 g/dL (ref 12.0–15.0)
MCH: 29.8 pg (ref 26.0–34.0)
MCHC: 34.5 g/dL (ref 30.0–36.0)
MCV: 86.3 fL (ref 80.0–100.0)
Platelets: 209 10*3/uL (ref 150–400)
RBC: 4.16 MIL/uL (ref 3.87–5.11)
RDW: 19.7 % — ABNORMAL HIGH (ref 11.5–15.5)
WBC: 11.3 10*3/uL — ABNORMAL HIGH (ref 4.0–10.5)
nRBC: 0 % (ref 0.0–0.2)

## 2020-08-31 LAB — RESP PANEL BY RT-PCR (FLU A&B, COVID) ARPGX2
Influenza A by PCR: NEGATIVE
Influenza B by PCR: NEGATIVE
SARS Coronavirus 2 by RT PCR: NEGATIVE

## 2020-08-31 LAB — TYPE AND SCREEN
ABO/RH(D): O POS
Antibody Screen: NEGATIVE

## 2020-08-31 MED ORDER — LIDOCAINE HCL (PF) 1 % IJ SOLN
INTRAMUSCULAR | Status: AC
  Administered 2020-09-01: 30 mL via SUBCUTANEOUS
  Filled 2020-08-31: qty 30

## 2020-08-31 MED ORDER — AMMONIA AROMATIC IN INHA
RESPIRATORY_TRACT | Status: AC
  Filled 2020-08-31: qty 10

## 2020-08-31 MED ORDER — LACTATED RINGERS IV SOLN: 500.0000 mL | INTRAVENOUS | Status: DC | PRN

## 2020-08-31 MED ORDER — MISOPROSTOL 200 MCG PO TABS
ORAL_TABLET | ORAL | Status: AC
  Filled 2020-08-31: qty 4

## 2020-08-31 MED ORDER — OXYTOCIN-SODIUM CHLORIDE 30-0.9 UT/500ML-% IV SOLN
2.5000 [IU]/h | INTRAVENOUS | Status: DC
  Administered 2020-09-01: 2.5 [IU]/h via INTRAVENOUS
  Filled 2020-08-31: qty 500

## 2020-08-31 MED ORDER — OXYTOCIN-SODIUM CHLORIDE 30-0.9 UT/500ML-% IV SOLN
INTRAVENOUS | Status: AC
  Administered 2020-08-31: 2 m[IU]/min via INTRAVENOUS
  Filled 2020-08-31: qty 500

## 2020-08-31 MED ORDER — OXYTOCIN BOLUS FROM INFUSION
333.0000 mL | Freq: Once | INTRAVENOUS | Status: AC
  Administered 2020-09-01: 333 mL via INTRAVENOUS

## 2020-08-31 MED ORDER — LIDOCAINE HCL (PF) 1 % IJ SOLN: 30.0000 mL | INTRAMUSCULAR | Status: AC | PRN

## 2020-08-31 MED ORDER — PENICILLIN G POT IN DEXTROSE 60000 UNIT/ML IV SOLN
3.0000 10*6.[IU] | INTRAVENOUS | Status: DC
Start: 2020-08-31 — End: 2020-09-01
  Administered 2020-08-31 (×2): 3 10*6.[IU] via INTRAVENOUS
  Filled 2020-08-31 (×5): qty 50

## 2020-08-31 MED ORDER — LACTATED RINGERS IV SOLN: INTRAVENOUS | Status: DC

## 2020-08-31 MED ORDER — SODIUM CHLORIDE 0.9 % IV SOLN
5.0000 10*6.[IU] | Freq: Once | INTRAVENOUS | Status: AC
  Administered 2020-08-31: 5 10*6.[IU] via INTRAVENOUS
  Filled 2020-08-31: qty 5

## 2020-08-31 MED ORDER — OXYTOCIN 10 UNIT/ML IJ SOLN
INTRAMUSCULAR | Status: AC
  Filled 2020-08-31: qty 2

## 2020-08-31 MED ORDER — SOD CITRATE-CITRIC ACID 500-334 MG/5ML PO SOLN: 30.0000 mL | ORAL | Status: DC | PRN

## 2020-08-31 MED ORDER — ONDANSETRON HCL 4 MG/2ML IJ SOLN: 4.0000 mg | Freq: Four times a day (QID) | INTRAMUSCULAR | Status: DC | PRN

## 2020-08-31 MED ORDER — OXYCODONE-ACETAMINOPHEN 5-325 MG PO TABS: 2.0000 | ORAL_TABLET | ORAL | Status: DC | PRN

## 2020-08-31 MED ORDER — OXYTOCIN-SODIUM CHLORIDE 30-0.9 UT/500ML-% IV SOLN: 1.0000 m[IU]/min | INTRAVENOUS | Status: DC

## 2020-08-31 MED ORDER — OXYCODONE-ACETAMINOPHEN 5-325 MG PO TABS: 1.0000 | ORAL_TABLET | ORAL | Status: DC | PRN

## 2020-08-31 MED ORDER — ACETAMINOPHEN 325 MG PO TABS: 650.0000 mg | ORAL_TABLET | ORAL | Status: DC | PRN

## 2020-08-31 MED ORDER — TERBUTALINE SULFATE 1 MG/ML IJ SOLN: 0.2500 mg | Freq: Once | INTRAMUSCULAR | Status: DC | PRN

## 2020-08-31 MED ORDER — BUTORPHANOL TARTRATE 1 MG/ML IJ SOLN: 1.0000 mg | INTRAMUSCULAR | Status: DC | PRN

## 2020-08-31 NOTE — Progress Notes (Signed)
  Labor Progress Note   35 y.o. [id number] @ [redacted]w[redacted]d , admitted for  Pregnancy, Labor Management.   Subjective:  Mod pain, no meds desired  Objective:  BP (!) 104/55 (BP Location: Right Arm)   Pulse 92   Temp 97.9 F (36.6 C) (Oral)   Resp 18   Ht 5\' 5"  (1.651 m)   Wt 71.2 kg   LMP 12/12/2019   BMI 26.13 kg/m  Abd: gravid, ND, FHT present, moderate tenderness on exam Extr: trace to 1+ bilateral pedal edema SVE: CERVIX: 7 cm dilated, 80 effaced, -1 station  EFM: FHR: 140 bpm, variability: moderate,  accelerations:  Present,  decelerations:  Absent Toco: Frequency: Every 2-3 minutes Labs: I have reviewed the patient's lab results.   Assessment & Plan:  12/14/2019 @ [redacted]w[redacted]d, admitted for  Pregnancy and Labor/Delivery Management  1. Pain management: none. 2. FWB: FHT category 1.  3. ID: GBS positive 4. Labor management: ABS for GBS Pitocin at 16 mU/min Progressing No pain meds desired  All discussed with patient, see orders  [redacted]w[redacted]d, MD, [facility] Major [facility]/G[facility], [facility] 08/31/2020  8:12 PM

## 2020-08-31 NOTE — Progress Notes (Signed)
  Subjective  Fetal Movement? yes Contractions? no Leaking Fluid? yes Vaginal Bleeding? no Pt c/o LOF, possible since Wednesday, more so today  Objective  BP 100/60   Wt 157 lb (71.2 kg)   LMP 12/12/2019   BMI 26.13 kg/m  General: NAD Pumonary: no increased work of breathing Abdomen: gravid, non-tender Extremities: no edema Psychiatric: mood appropriate, affect full SSE- pooling SVE- 2/80/-2 FERN POS  Assessment  35 y.o. G3P1011 at [redacted]w[redacted]d by  09/17/2020, by Last Menstrual Period presenting for routine prenatal visit  Plan   Problem List Items Addressed This Visit   None Visit Diagnoses    Prolonged spontaneous rupture of membranes    -  Primary   [redacted] weeks gestation of pregnancy        Discussed need for delivery, ABX for GBS and possible prolonged SROM  G2 Problems (from 02/27/20 to present)    Problem Noted Resolved   Supervision of other normal pregnancy, antepartum 02/28/2020 by Vena Austria, MD No   Overview Addendum 08/29/2020  9:28 AM by Tresea Mall, CNM     Nursing Staff Provider  Office Location  [facility] Dating   LMP = 11 [place]  Language  [place] Anatomy US  completed  Flu Vaccine   Genetic Screen  NIPS: normal XY  TDaP vaccine    Hgb A1C or  GTT  Third trimester :   Covid Had in first trimester of pregnancy   LAB RESULTS   Rhogam   not needed Blood Type O/Positive/-- (02/07 1114)   Feeding Plan  Antibody Negative (02/07 1114)  Contraception Paragard Rubella 20.60 (02/07 1114)  Circumcision  RPR Non Reactive (02/07 1114)   Pediatrician   HBsAg Negative (02/07 1114)   Support Person  HIV Non Reactive (02/07 1114)  Prenatal Classes  Varicella  immune    GBS  (For PCN allergy, check sensitivities)   BTL Consent     VBAC Consent  Pap  NIL    Hgb Electro   negative    CF  negative     SMA  negative                  Stacie Major, MD, [facility], [facility] 08/31/2020  9:42 AM

## 2020-08-31 NOTE — H&P (Signed)
Stacie Garrison is a 35 y.o. female presenting for induction of labor secondary to prolongued rupture of membranes diagnosed this morning. She had contacted the [facility] office and reported questionable LOF since Wednesday evening. She expressed uncertainty regarding exactly when she might have started losing amniotic fluid. Her baby has been moving well, and the patient reports feeling occasional mild contractions.. OB History     Gravida  4   Para  1   Term  1   Preterm      AB  2   Living  1      SAB  1   IAB      Ectopic      Multiple  1   Live Births             Past Medical History:  Diagnosis Date   COVID-19 02/02/2020   Symptomatic since about 01/29/2020, tested + in [facility] ED on 02/02/2020   Medical history non-contributory    Past Surgical History:  Procedure Laterality Date   APPENDECTOMY     Family History: family history includes Heart Problems in her maternal grandmother; Hypertension in her maternal grandmother and mother. Social History:  reports that she has never smoked. She has never used smokeless tobacco. She reports that she does not drink alcohol and does not use drugs.     Maternal Diabetes: No Genetic Screening: Normal Maternal Ultrasounds/Referrals: Normal Fetal Ultrasounds or other Referrals:  None Maternal Substance Abuse:  No Significant Maternal Medications:  None Significant Maternal Lab Results:  Group B Strep positive Other Comments:  None  Review of Systems History Dilation: 3.5 Effacement (%): 70 Station: -2 Exam by:: Paula Compton CNM Blood pressure 107/63, pulse 100, temperature 97.9 F (36.6 C), temperature source Oral, resp. rate 16, height 5\' 5"  (1.651 m), weight 71.2 kg, last menstrual period 12/12/2019, unknown if currently breastfeeding. Maternal Exam:  Uterine Assessment: Contraction strength is mild.  Contraction frequency is rare.  Abdomen: Estimated fetal weight is 6.5lbs.   Fetal presentation:  vertex Introitus: Normal vulva. Normal vagina.  Ferning test: positive.  Amniotic fluid character: clear. Pelvis: adequate for delivery.   Cervix: Cervix evaluated by digital exam.    Physical Exam Constitutional:      Appearance: Normal appearance.  HENT:     Head: Normocephalic and atraumatic.  Cardiovascular:     Rate and Rhythm: Normal rate and regular rhythm.     Pulses: Normal pulses.     Heart sounds: Normal heart sounds.  Pulmonary:     Effort: Pulmonary effort is normal.     Breath sounds: Normal breath sounds.  Abdominal:     Palpations: Abdomen is soft.     Comments: Gravid uterus  Genitourinary:    General: Normal vulva.     Rectum: Normal.     Comments: No active flow of fluid. Musculoskeletal:        General: Normal range of motion.     Cervical back: Normal range of motion and neck supple.  Skin:    General: Skin is warm and dry.  Neurological:     General: No focal deficit present.     Mental Status: She is alert and oriented to person, place, and time.  Psychiatric:        Mood and Affect: Mood normal.        Behavior: Behavior normal.    Prenatal labs: ABO, Rh: O/Positive/-- (02/07 1114) Antibody: Negative (02/07 1114) Rubella: 20.60 (02/07 1114) RPR: Non Reactive (06/13 0934)  HBsAg: Negative (02/07 1114)  HIV: Non Reactive (06/13 0934)  GBS: Positive/-- (08/02 1350)   Assessment/Plan: IUP 37 weeks 4 days Prolongued rupture of membranes GBS positive Early labor. Favorable cervix. Plan:  Admitted to labor and Delviery IV start. PCN for GBS+ status. Pitocin per protocol Anticipate SVD  Mirna Mires, CNM  08/31/2020 1:13 PM     Mirna Mires 08/31/2020, 12:48 PM

## 2020-08-31 NOTE — Telephone Encounter (Signed)
Patient reports leaking fluid intermittently since yesterday. Having infrequent intermittent contractions. Appointment scheduled for 8:40 today w/R[facility] for eval.

## 2020-09-01 ENCOUNTER — Encounter: Payer: Self-pay | Admitting: Obstetrics & Gynecology

## 2020-09-01 DIAGNOSIS — Z3A37 37 weeks gestation of pregnancy: Secondary | ICD-10-CM

## 2020-09-01 DIAGNOSIS — O4292 Full-term premature rupture of membranes, unspecified as to length of time between rupture and onset of labor: Secondary | ICD-10-CM | POA: Diagnosis not present

## 2020-09-01 LAB — CBC
HCT: 31.1 % — ABNORMAL LOW (ref 36.0–46.0)
Hemoglobin: 10.6 g/dL — ABNORMAL LOW (ref 12.0–15.0)
MCH: 29.4 pg (ref 26.0–34.0)
MCHC: 34.1 g/dL (ref 30.0–36.0)
MCV: 86.1 fL (ref 80.0–100.0)
Platelets: 200 10*3/uL (ref 150–400)
RBC: 3.61 MIL/uL — ABNORMAL LOW (ref 3.87–5.11)
RDW: 19.1 % — ABNORMAL HIGH (ref 11.5–15.5)
WBC: 16.2 10*3/uL — ABNORMAL HIGH (ref 4.0–10.5)
nRBC: 0 % (ref 0.0–0.2)

## 2020-09-01 LAB — RPR: RPR Ser Ql: NONREACTIVE

## 2020-09-01 MED ORDER — ZOLPIDEM TARTRATE 5 MG PO TABS: 5.0000 mg | ORAL_TABLET | Freq: Every evening | ORAL | Status: DC | PRN

## 2020-09-01 MED ORDER — SODIUM CHLORIDE 0.9 % IV SOLN: 250.0000 mL | INTRAVENOUS | Status: DC | PRN

## 2020-09-01 MED ORDER — SIMETHICONE 80 MG PO CHEW: 80.0000 mg | CHEWABLE_TABLET | ORAL | Status: DC | PRN

## 2020-09-01 MED ORDER — IBUPROFEN 600 MG PO TABS
600.0000 mg | ORAL_TABLET | Freq: Four times a day (QID) | ORAL | Status: DC
  Administered 2020-09-01 – 2020-09-02 (×6): 600 mg via ORAL
  Filled 2020-09-01 (×6): qty 1

## 2020-09-01 MED ORDER — OXYCODONE-ACETAMINOPHEN 5-325 MG PO TABS: 1.0000 | ORAL_TABLET | ORAL | Status: DC | PRN

## 2020-09-01 MED ORDER — ACETAMINOPHEN 325 MG PO TABS: 650.0000 mg | ORAL_TABLET | ORAL | Status: DC | PRN

## 2020-09-01 MED ORDER — OXYCODONE-ACETAMINOPHEN 5-325 MG PO TABS: 2.0000 | ORAL_TABLET | ORAL | Status: DC | PRN

## 2020-09-01 MED ORDER — DIBUCAINE (PERIANAL) 1 % EX OINT: 1.0000 "application " | TOPICAL_OINTMENT | CUTANEOUS | Status: DC | PRN

## 2020-09-01 MED ORDER — ONDANSETRON HCL 4 MG/2ML IJ SOLN: 4.0000 mg | INTRAMUSCULAR | Status: DC | PRN

## 2020-09-01 MED ORDER — ONDANSETRON HCL 4 MG PO TABS: 4.0000 mg | ORAL_TABLET | ORAL | Status: DC | PRN

## 2020-09-01 MED ORDER — WITCH HAZEL-GLYCERIN EX PADS: 1.0000 "application " | MEDICATED_PAD | CUTANEOUS | Status: DC | PRN

## 2020-09-01 MED ORDER — SODIUM CHLORIDE 0.9% FLUSH: 3.0000 mL | INTRAVENOUS | Status: DC | PRN

## 2020-09-01 MED ORDER — BENZOCAINE-MENTHOL 20-0.5 % EX AERO: 1.0000 "application " | INHALATION_SPRAY | CUTANEOUS | Status: DC | PRN

## 2020-09-01 MED ORDER — COCONUT OIL OIL: 1.0000 "application " | TOPICAL_OIL | Status: DC | PRN

## 2020-09-01 MED ORDER — SENNOSIDES-DOCUSATE SODIUM 8.6-50 MG PO TABS
2.0000 | ORAL_TABLET | ORAL | Status: DC
  Administered 2020-09-01 – 2020-09-02 (×2): 2 via ORAL
  Filled 2020-09-01 (×2): qty 2

## 2020-09-01 MED ORDER — SODIUM CHLORIDE 0.9% FLUSH: 3.0000 mL | Freq: Two times a day (BID) | INTRAVENOUS | Status: DC

## 2020-09-01 MED ORDER — DIPHENHYDRAMINE HCL 25 MG PO CAPS: 25.0000 mg | ORAL_CAPSULE | Freq: Four times a day (QID) | ORAL | Status: DC | PRN

## 2020-09-01 NOTE — Discharge Summary (Signed)
Postpartum Discharge Summary    Patient Name: Stacie Garrison DOB: Dec 13, 1985 MRN: [id number]  Date of admission: 08/31/2020 Delivery date:09/01/2020  Delivering provider: Gae Dry  Date of discharge: 09/02/2020  Admitting diagnosis: Prolonged spontaneous rupture of membranes [O42.90] Intrauterine pregnancy: [redacted]w[redacted]d    Secondary diagnosis:  Active Problems:   Prolonged spontaneous rupture of membranes  Additional problems: None    Discharge diagnosis: Term Pregnancy Delivered                                              Post partum procedures: none Augmentation: Pitocin Complications: None  Hospital course: Onset of Labor With Vaginal Delivery      35y.o. yo [id number] admitted in Latent Labor on 08/31/2020. Patient had an uncomplicated labor course as follows:  Membrane Rupture Time/Date: 6:00 PM ,08/29/2020   Delivery Method:Vaginal, Spontaneous  Episiotomy: None  Lacerations:  2nd degree  Patient had an uncomplicated postpartum course.  She is ambulating, tolerating a regular diet, passing flatus, and urinating well. Patient is discharged home in stable condition on 09/02/20.  Newborn Data: Birth date:09/01/2020  Birth time:12:59 AM  Gender:Female  Living status:Living  Apgars:7 ,9  Weight:3650 g   Magnesium Sulfate received: No BMZ received: Yes Rhophylac:No MMR:No T-DaP:Given prenatally Flu: N/A Transfusion:No  Physical exam  Vitals:   09/01/20 1526 09/01/20 2303 09/01/20 2331 09/02/20 0712  BP: 107/70 (!) '90/57 99/65 94/64 '  Pulse: (!) 104 94 89 84  Resp: '18 20  18  ' Temp: 98.5 F (36.9 C) 98 F (36.7 C)  97.8 F (36.6 C)  TempSrc: Oral Oral  Oral  SpO2: 100% 100%  100%  Weight:      Height:       General: alert, cooperative, and no distress Lochia: appropriate Uterine Fundus: firm DVT Evaluation: No evidence of DVT seen on physical exam. Labs: Lab Results  Component Value Date   WBC 16.2 (H) 09/01/2020   HGB 10.6 (L) 09/01/2020    HCT 31.1 (L) 09/01/2020   MCV 86.1 09/01/2020   PLT 200 09/01/2020   CMP Latest Ref Rng & Units 02/01/2020  Glucose 70 - 99 mg/dL 143(H)  BUN 6 - 20 mg/dL 9  Creatinine 0.44 - 1.00 mg/dL 0.56  Sodium 135 - 145 mmol/L 132(L)  Potassium 3.5 - 5.1 mmol/L 3.0(L)  Chloride 98 - 111 mmol/L 101  CO2 22 - 32 mmol/L 22  Calcium 8.9 - 10.3 mg/dL 9.2  Total Protein 6.5 - 8.1 g/dL 7.6  Total Bilirubin 0.3 - 1.2 mg/dL 0.5  Alkaline Phos 38 - 126 U/L 40  AST 15 - 41 U/L 18  ALT 0 - 44 U/L 13   Edinburgh Score: Edinburgh Postnatal Depression Scale Screening Tool 09/01/2020  I have been able to laugh and see the funny side of things. 0  I have looked forward with enjoyment to things. 0  I have blamed myself unnecessarily when things went wrong. 0  I have been anxious or worried for no good reason. 0  I have felt scared or panicky for no good reason. 0  Things have been getting on top of me. 1  I have been so unhappy that I have had difficulty sleeping. 0  I have felt sad or miserable. 0  I have been so unhappy that I have been crying. 0  The thought of harming myself has occurred to me. 0  Edinburgh Postnatal Depression Scale Total 1      After visit meds:  Allergies as of 09/02/2020   No Known Allergies      Medication List     STOP taking these medications    Doxylamine-Pyridoxine 10-10 MG Tbec Commonly known as: Diclegis   misoprostol 200 MCG tablet Commonly known as: CYTOTEC   nitrofurantoin (macrocrystal-monohydrate) 100 MG capsule Commonly known as: MACROBID   ondansetron 4 MG disintegrating tablet Commonly known as: Zofran ODT   traMADol 50 MG tablet Commonly known as: ULTRAM       TAKE these medications    ibuprofen 600 MG tablet Commonly known as: ADVIL Take 1 tablet (600 mg total) by mouth every 6 (six) hours.   multivitamin-prenatal 27-0.8 MG Tabs tablet Take 1 tablet by mouth daily at 12 noon.         Discharge home in stable condition Infant  Feeding: Breast Infant Disposition:home with mother Discharge instruction: per After Visit Summary and Postpartum booklet. Activity: Advance as tolerated. Pelvic rest for 6 weeks.  Diet: routine diet Anticipated Birth Control: IUD Postpartum Appointment:6 weeks Additional Postpartum F/U:  none Future Appointments:No future appointments. Follow up Visit:  Follow-up Information     Gae Dry, MD. Go in 6 week(s).   Specialty: Obstetrics and Gynecology Why: For [place] (vietnamese interpreter) Contact information: [place] [address] [phone]                     09/02/2020 Malachy Mood, MD

## 2020-09-02 MED ORDER — IBUPROFEN 600 MG PO TABS
600.0000 mg | ORAL_TABLET | Freq: Four times a day (QID) | ORAL | 0 refills | Status: DC
Start: 2020-09-02 — End: 2022-02-17

## 2020-09-02 NOTE — Progress Notes (Signed)
Pt discharged with infant.  Discharge instructions, prescriptions and follow up appointment given to and reviewed with pt via interpreter. Pt verbalized understanding. Escorted out by auxillary. 

## 2020-09-06 ENCOUNTER — Encounter: Payer: 59 | Admitting: Obstetrics

## 2020-09-10 ENCOUNTER — Encounter: Payer: 59 | Admitting: Advanced Practice Midwife

## 2020-10-02 ENCOUNTER — Ambulatory Visit (INDEPENDENT_AMBULATORY_CARE_PROVIDER_SITE_OTHER): Payer: 59 | Admitting: Obstetrics & Gynecology

## 2020-10-02 ENCOUNTER — Encounter: Payer: Self-pay | Admitting: Obstetrics & Gynecology

## 2020-10-02 ENCOUNTER — Other Ambulatory Visit: Payer: Self-pay

## 2020-10-02 VITALS — BP 100/70 | Ht 65.0 in | Wt 135.0 lb

## 2020-10-02 DIAGNOSIS — N3000 Acute cystitis without hematuria: Secondary | ICD-10-CM

## 2020-10-02 DIAGNOSIS — R300 Dysuria: Secondary | ICD-10-CM | POA: Diagnosis not present

## 2020-10-02 LAB — POCT URINALYSIS DIPSTICK
Bilirubin, UA: NEGATIVE
Blood, UA: NEGATIVE
Glucose, UA: NEGATIVE
Ketones, UA: NEGATIVE
Nitrite, UA: NEGATIVE
Protein, UA: NEGATIVE
Spec Grav, UA: 1.01 (ref 1.010–1.025)
Urobilinogen, UA: 0.2 E.U./dL
pH, UA: 5 (ref 5.0–8.0)

## 2020-10-02 MED ORDER — SULFAMETHOXAZOLE-TRIMETHOPRIM 800-160 MG PO TABS: 1.0000 | ORAL_TABLET | Freq: Two times a day (BID) | ORAL | 1 refills | Status: AC

## 2020-10-02 NOTE — Patient Instructions (Signed)

## 2020-10-02 NOTE — Progress Notes (Signed)
HPI:      Stacie Garrison is a 35 y.o. [id number] who LMP was No LMP recorded., presents today for a problem visit.    Urinary Tract Infection: Patient complains of dysuria and suprapubic pressure . She has had symptoms for 2 days. Patient also complains of  stitch area from delivery coming undone (stitch out) . Patient denies fever. Patient does not have a history of recurrent UTI.  Patient does not have a history of pyelonephritis.   PMHx: She  has a past medical history of COVID-19 (02/02/2020) and Medical history non-contributory. Also,  has a past surgical history that includes Appendectomy., family history includes Heart Problems in her maternal grandmother; Hypertension in her maternal grandmother and mother.,  reports that she has never smoked. She has never used smokeless tobacco. She reports that she does not drink alcohol and does not use drugs.  She has a current medication list which includes the following prescription(s): sulfamethoxazole-trimethoprim, ibuprofen, and multivitamin-prenatal. Also, has No Known Allergies.  Review of Systems  Constitutional:  Negative for chills, fever and malaise/fatigue.  HENT:  Negative for congestion, sinus pain and sore throat.   Eyes:  Negative for blurred vision and pain.  Respiratory:  Negative for cough and wheezing.   Cardiovascular:  Negative for chest pain and leg swelling.  Gastrointestinal:  Negative for abdominal pain, constipation, diarrhea, heartburn, nausea and vomiting.  Genitourinary:  Positive for dysuria. Negative for frequency, hematuria and urgency.  Musculoskeletal:  Negative for back pain, joint pain, myalgias and neck pain.  Skin:  Negative for itching and rash.  Neurological:  Negative for dizziness, tremors and weakness.  Endo/Heme/Allergies:  Does not bruise/bleed easily.  Psychiatric/Behavioral:  Negative for depression. The patient is not nervous/anxious and does not have insomnia.    Objective: BP 100/70   Ht 5\' 5"   (1.651 m)   Wt 135 lb (61.2 kg)   BMI 22.47 kg/m  Physical Exam Constitutional:      General: She is not in acute distress.    Appearance: She is well-developed.  Genitourinary:     Right Labia: No rash or tenderness.    Left Labia: No tenderness or rash.    No vaginal erythema or bleeding.     Vaginal exam comments: Tissue well healed but suture 3 cm out but still attached on one end at introitus; this is cut at level of skin.      Right Adnexa: not tender and no mass present.    Left Adnexa: not tender and no mass present.    No cervical motion tenderness, discharge, polyp or nabothian cyst.     Uterus is not enlarged.     No uterine mass detected.    Pelvic exam was performed with patient in the lithotomy position.  HENT:     Head: Normocephalic and atraumatic.     Nose: Nose normal.  Abdominal:     General: There is no distension.     Palpations: Abdomen is soft.     Tenderness: There is no abdominal tenderness.  Musculoskeletal:        General: Normal range of motion.  Neurological:     Mental Status: She is alert and oriented to person, place, and time.     Cranial Nerves: No cranial nerve deficit.  Skin:    General: Skin is warm and dry.  Psychiatric:        Attention and Perception: Attention normal.        Mood and Affect:  Mood and affect normal.        Speech: Speech normal.        Behavior: Behavior normal.        Thought Content: Thought content normal.        Judgment: Judgment normal.   Results for orders placed or performed in visit on 10/02/20  POCT urinalysis dipstick  Result Value Ref Range   Color, UA     Clarity, UA     Glucose, UA Negative Negative   Bilirubin, UA Negative    Ketones, UA Negative    Spec Grav, UA 1.010 1.010 - 1.025   Blood, UA Negative    pH, UA 5.0 5.0 - 8.0   Protein, UA Negative Negative   Urobilinogen, UA 0.2 0.2 or 1.0 E.U./dL   Nitrite, UA Negative    Leukocytes, UA Moderate (2+) (A) Negative   Appearance     Odor       ASSESSMENT/PLAN:   Acute cystitis   ICD-10-CM   1. Dysuria  R30.0 POCT urinalysis dipstick    2. Acute cystitis without hematuria  N30.00     Bactrim for UTI Suture at delivery laceration site cut and removed F/U for IUD later  Annamarie Major, MD, [facility], [facility] 10/02/2020  4:05 PM

## 2020-10-18 ENCOUNTER — Other Ambulatory Visit (HOSPITAL_COMMUNITY)
Admission: RE | Admit: 2020-10-18 | Discharge: 2020-10-18 | Disposition: A | Discharge status: Home / Self Care | Payer: 59 | Source: Ambulatory Visit | Attending: Obstetrics & Gynecology | Admitting: Obstetrics & Gynecology

## 2020-10-18 ENCOUNTER — Ambulatory Visit (INDEPENDENT_AMBULATORY_CARE_PROVIDER_SITE_OTHER): Payer: 59 | Admitting: Obstetrics & Gynecology

## 2020-10-18 ENCOUNTER — Other Ambulatory Visit: Payer: Self-pay

## 2020-10-18 ENCOUNTER — Encounter: Payer: Self-pay | Admitting: Obstetrics & Gynecology

## 2020-10-18 DIAGNOSIS — Z124 Encounter for screening for malignant neoplasm of cervix: Secondary | ICD-10-CM

## 2020-10-18 DIAGNOSIS — Z30430 Encounter for insertion of intrauterine contraceptive device: Secondary | ICD-10-CM | POA: Diagnosis not present

## 2020-10-18 NOTE — Progress Notes (Signed)
  OBSTETRICS POSTPARTUM CLINIC PROGRESS NOTE  Subjective:     Stacie Garrison is a 35 y.o. [phone] female who presents for a postpartum visit. She is 6 weeks postpartum following a Term pregnancy, Single fetus, or Uncomplicated pregnancy and delivery by Vaginal, no problems at delivery.  I have fully reviewed the prenatal and intrapartum course. "[name]" Anesthesia: none.  Postpartum course has been complicated by complicated by none .  Baby is feeding by Bottle.  Bleeding: patient has not  resumed menses.  Bowel function is normal. Bladder function is normal.  Patient is not sexually active. Contraception method desired is IUD.  Postpartum depression screening: negative. Edinburgh 0.  The following portions of the patient's history were reviewed and updated as appropriate: allergies, current medications, past family history, past medical history, past social history, past surgical history, and problem list.  Review of Systems Pertinent items are noted in HPI.  Objective:    BP 120/80   Ht 5\' 5"  (1.651 m)   Wt 135 lb (61.2 kg)   BMI 22.47 kg/m   General:  alert and no distress   Breasts:  inspection negative, no nipple discharge or bleeding, no masses or nodularity palpable  Lungs: clear to auscultation bilaterally  Heart:  regular rate and rhythm, S1, S2 normal, no murmur, click, rub or gallop  Abdomen: soft, non-tender; bowel sounds normal; no masses,  no organomegaly.     Vulva:  Normal; well healed from 2nd degree repair  Vagina: normal vagina, no discharge, exudate, lesion, or erythema  Cervix:  no cervical motion tenderness and no lesions  Corpus: normal size, contour, position, consistency, mobility, non-tender  Adnexa:  normal adnexa and no mass, fullness, tenderness  Rectal Exam: Not performed.          Assessment:  Post Partum Care visit 1. Postpartum care and examination  2. Screening for malignant neoplasm of cervix - Cytology - PAP  3. Encounter for IUD  insertion See below  Plan:  See orders and Patient Instructions Resume all normal activities Follow up in: 4 weeks or as needed.     IUD PROCEDURE NOTE:  [name] is a 35 y.o. 20 here for IUD insertion. No GYN concerns.  Last pap smear was normal.  IUD Insertion Procedure Note Patient identified, informed consent performed, consent signed.   Discussed risks of irregular bleeding, cramping, infection, malpositioning or misplacement of the IUD outside the uterus which may require further procedure such as laparoscopy, risk of failure <1%. Time out was performed.  Urine pregnancy test negative.  A bimanual exam showed the uterus to be midposition.  Speculum placed in the vagina.  Cervix visualized.  Cleaned with Betadine x 2.  Grasped anteriorly with a single tooth tenaculum.  Uterus sounded to 7 cm.   Mirena IUD placed per manufacturer's recommendations.  Strings trimmed to 3 cm. Tenaculum was removed, good hemostasis noted.  Patient tolerated procedure well.   Patient was given post-procedure instructions.  She was advised to have backup contraception for one week.  Patient was also asked to check IUD strings periodically and follow up in 4 weeks for IUD check.  [id number], MD, An[facility] Major [facility]/G[facility], [facility] 10/18/2020  1:40 PM

## 2020-10-18 NOTE — Patient Instructions (Signed)
Intrauterine Device Insertion, Care After This sheet gives you information about how to care for yourself after your procedure. Your health care provider may also give you more specific instructions. If you have problems or questions, contact your health care provider. What can I expect after the procedure? After the procedure, it is common to have: Cramps and pain in the abdomen. Bleeding. It may be light or heavy. This may last for a few days. Lower back pain. Dizziness. Headaches. Nausea. Follow these instructions at home:  Before resuming sexual activity, check to make sure that you can feel the IUD string or strings. You should be able to feel the end of the string below the opening of your cervix. If your IUD string is in place, you may resume sexual activity. If you had a hormonal IUD inserted more than 7 days after your most recent period started, you will need to use a backup method of birth control for 7 days after IUD insertion. Ask your health care provider whether this applies to you. Continue to check that the IUD is still in place by feeling for the strings after every menstrual period, or once a month. An IUD will not protect you from sexually transmitted infections (STIs). Use methods to prevent the exchange of body fluids between partners (barrier protection) every time you have sex. Barrier protection can be used during oral, vaginal, or anal sex. Commonly used barrier methods include: Female condom. Female condom. Dental dam. Take over-the-counter and prescription medicines only as told by your health care provider. Keep all follow-up visits as told by your health care provider. This is important. Contact a health care provider if: You feel light-headed or weak. You have any of the following problems with your IUD string or strings: The string bothers or hurts you or your sexual partner. You cannot feel the string. The string has gotten longer. You can feel the IUD in  your vagina. You think you may be pregnant, or you miss your menstrual period. You think you may have a sexually transmitted infection (STI). Get help right away if: You have flu-like symptoms, such as tiredness (fatigue) and muscle aches. You have a fever and chills. You have bleeding that is heavier or lasts longer than a normal menstrual cycle. You have abnormal or bad-smelling discharge from your vagina. You develop abdominal pain that is new, is getting worse, or is not in the same area of earlier cramping and pain. You have pain during sexual activity. Summary After the procedure, it is common to have cramps and pain in the abdomen. It is also common to have light bleeding or heavier bleeding that is like your menstrual period. Continue to check that the IUD is still in place by feeling for the strings after every menstrual period, or once a month. Keep all follow-up visits as told by your health care provider. This is important. Contact your health care provider if you have problems with your IUD strings, such as the string getting longer or bothering you or your sexual partner. This information is not intended to replace advice given to you by your health care provider. Make sure you discuss any questions you have with your health care provider. Document Revised: 12/28/2018 Document Reviewed: 12/28/2018 Elsevier Patient Education  2022 Elsevier Inc.  

## 2020-10-22 LAB — CYTOLOGY - PAP
Comment: NEGATIVE
Diagnosis: NEGATIVE
High risk HPV: NEGATIVE

## 2020-11-15 ENCOUNTER — Encounter: Payer: Self-pay | Admitting: Obstetrics & Gynecology

## 2020-11-15 ENCOUNTER — Ambulatory Visit (INDEPENDENT_AMBULATORY_CARE_PROVIDER_SITE_OTHER): Payer: 59 | Admitting: Obstetrics & Gynecology

## 2020-11-15 ENCOUNTER — Other Ambulatory Visit: Payer: Self-pay

## 2020-11-15 VITALS — BP 90/60 | Ht 65.0 in | Wt 134.0 lb

## 2020-11-15 DIAGNOSIS — Z30431 Encounter for routine checking of intrauterine contraceptive device: Secondary | ICD-10-CM | POA: Diagnosis not present

## 2020-11-15 NOTE — Progress Notes (Signed)
  History of Present Illness:  Stacie Garrison is a 35 y.o. that had a Mirena IUD placed approximately 4 weeks ago. Since that time, she states that she has had no pain and mild light irreg bleeding.  PMHx: She  has a past medical history of COVID-19 (02/02/2020) and Medical history non-contributory. Also,  has a past surgical history that includes Appendectomy., family history includes Heart Problems in her maternal grandmother; Hypertension in her maternal grandmother and mother.,  reports that she has never smoked. She has never used smokeless tobacco. She reports that she does not drink alcohol and does not use drugs. Current Meds  Medication Sig   levonorgestrel (MIRENA) 20 MCG/DAY IUD 1 each by Intrauterine route once.  .  Also, has No Known Allergies..  Review of Systems  All other systems reviewed and are negative.  Physical Exam:  BP 90/60   Ht 5\' 5"  (1.651 m)   Wt 134 lb (60.8 kg)   BMI 22.30 kg/m  Body mass index is 22.3 kg/m. Constitutional: Well nourished, well developed female in no acute distress.  Abdomen: diffusely non tender to palpation, non distended, and no masses, hernias Neuro: Grossly intact Psych:  Normal mood and affect.    Pelvic exam:  Two IUD strings present seen coming from the cervical os. EGBUS, vaginal vault and cervix: within normal limits  Assessment: IUD strings present in proper location; pt doing well  Plan: She was told to continue to use barrier contraception, in order to prevent any STIs, and to take a home pregnancy test or call if she ever thinks she may be pregnant, and that her IUD expires in 8 years.  She was amenable to this plan and we will see her back in 1 year/PRN.  A total of 21 minutes were spent face-to-face with the patient as well as preparation, review, communication, and documentation during this encounter.   [place], MD, Annam[facility] Major [facility]/Gyn, [facility] 11/15/2020  10:44 AM

## 2021-06-11 IMAGING — US US OB < 14 WEEKS - US OB TV
1 series · 14 of 28 positions shown · non-contrast
Comparison: None.

CLINICAL DATA: Vaginal bleeding, beta HCG 148,885

EXAM:
OBSTETRIC <14 WK US AND TRANSVAGINAL OB US
TECHNIQUE: Both transabdominal and transvaginal ultrasound examinations were
performed for complete evaluation of the gestation as well as the
maternal uterus, adnexal regions, and pelvic cul-de-sac.
Transvaginal technique was performed to assess early pregnancy.

[Series 1: us ob less than 14 weeks with ob transvaginal · 14 of 94 slices shown]
[im 4/94]
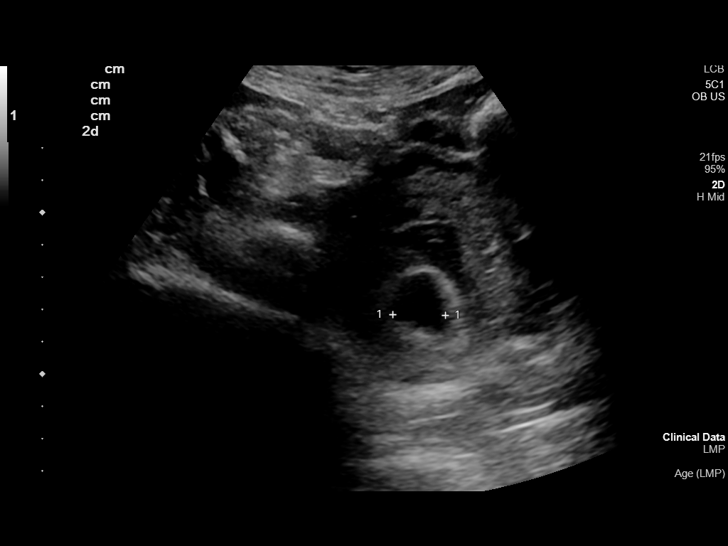
[im 11/94]
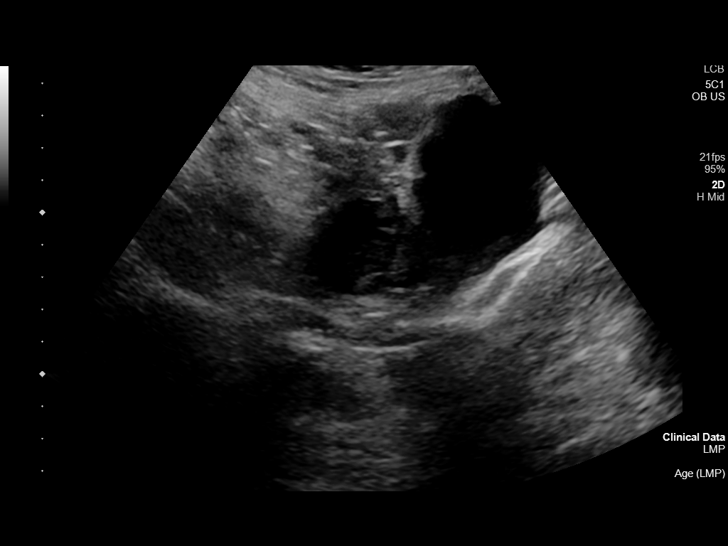
[im 18/94]
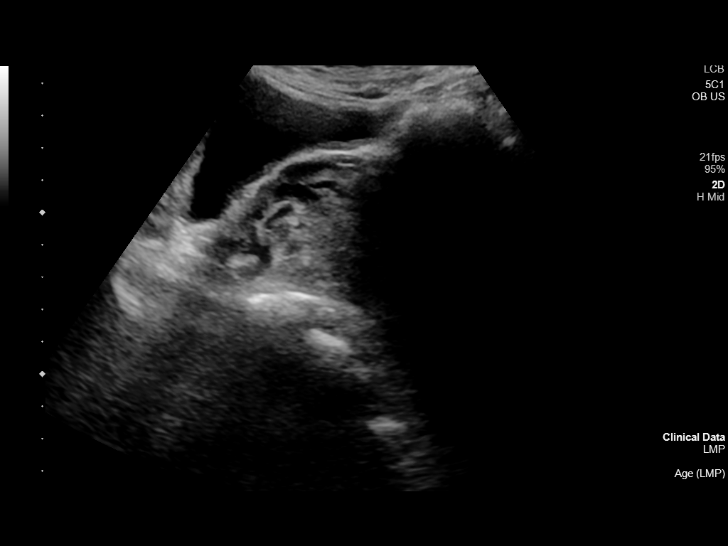
[im 25/94]
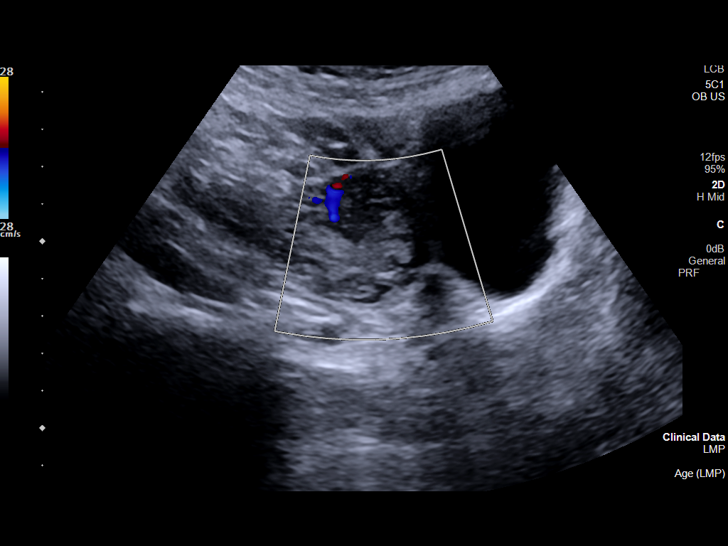
[im 32/94]
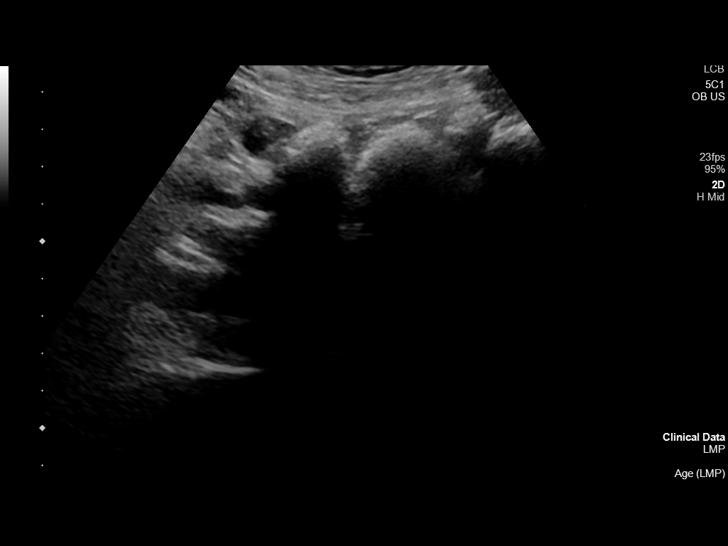
[im 38/94]
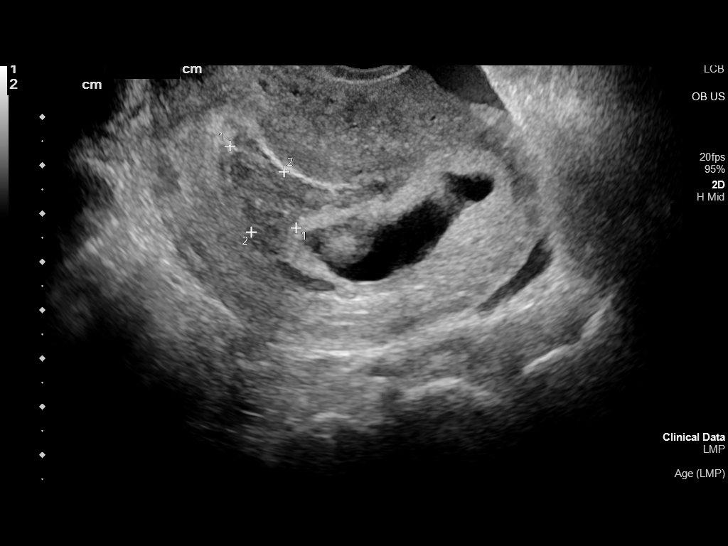
[im 45/94]
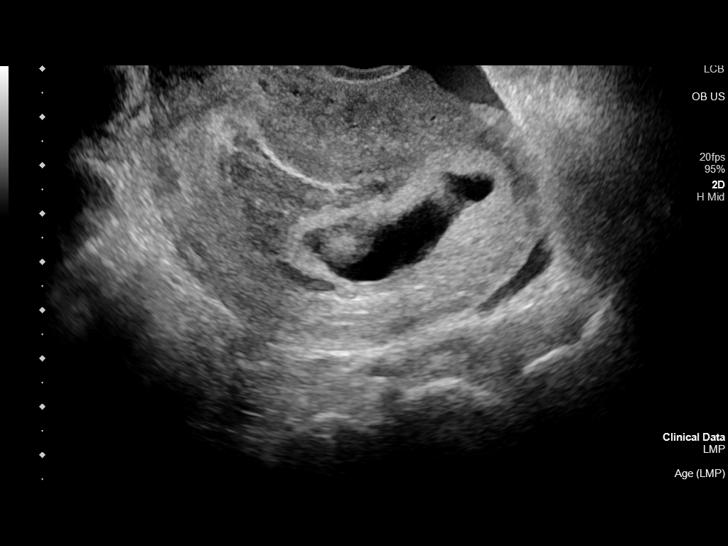
[im 52/94]
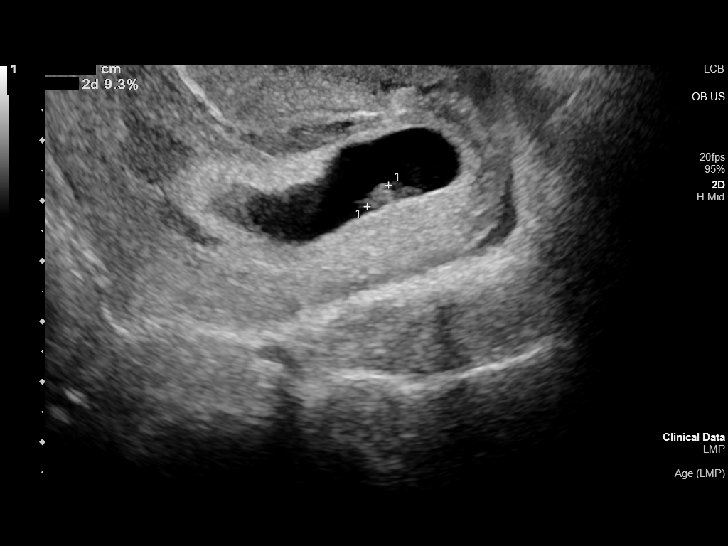
[im 59/94]
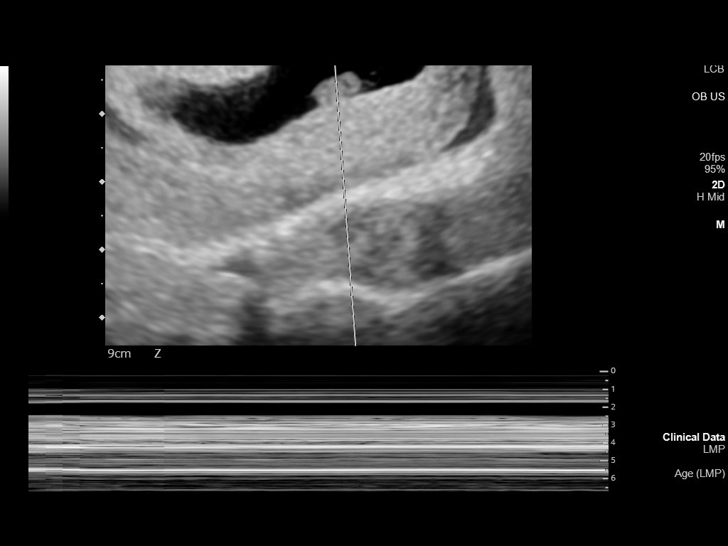
[im 66/94]
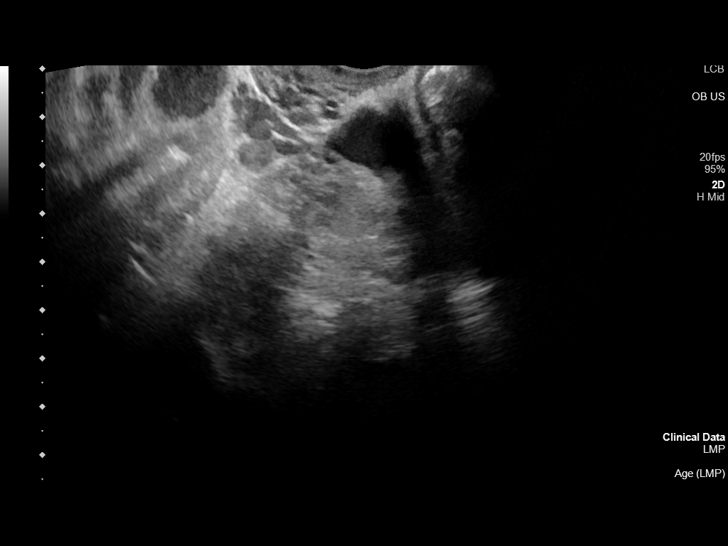
[im 73/94]
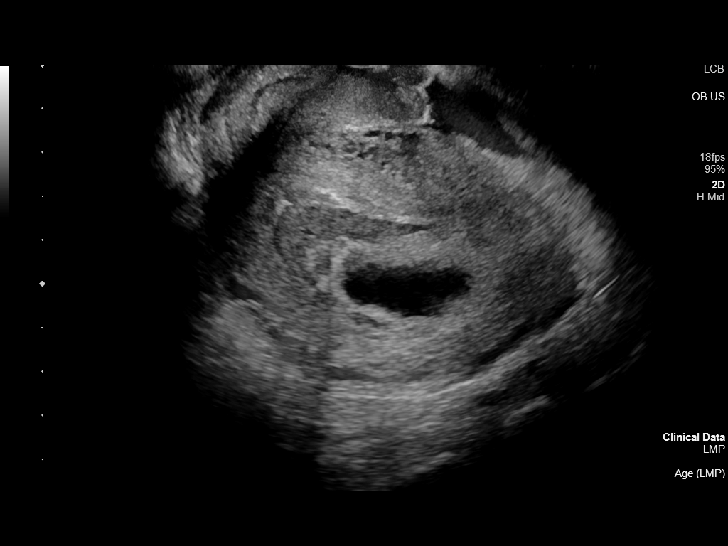
[im 80/94]
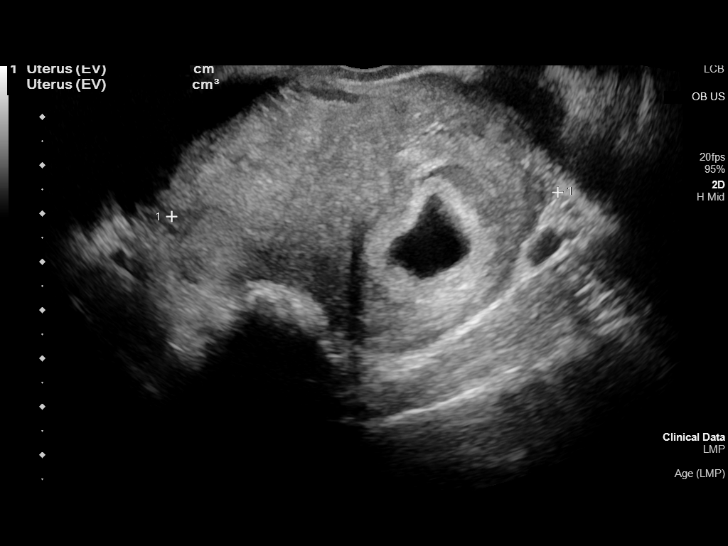
[im 87/94]
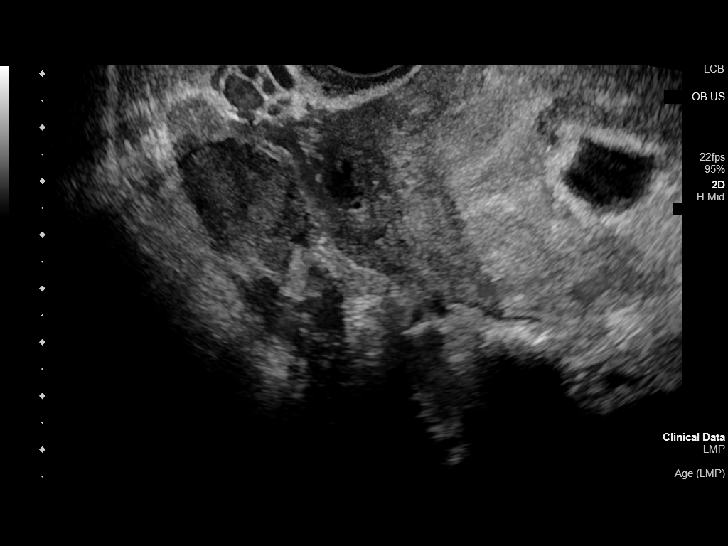
[im 94/94]
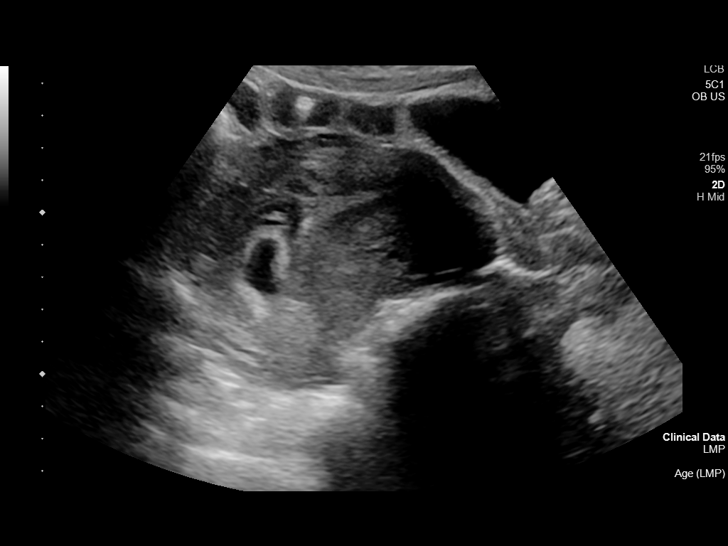

[14 of 28 positions shown; findings below may reference images not displayed]

FINDINGS: Intrauterine gestational sac: Single

Yolk sac:  Visualized.

Embryo:  Visualized.

Cardiac Activity: Visualized.

Heart Rate: 84 bpm

CRL:  5.0 mm   6 w   2 d                  US EDC: 03/08/2020

Subchorionic hemorrhage: A large subchorionic hemorrhage is seen
inferior to the gestational sac, measuring 2.2 x 1.4 x 2.2 cm.

Maternal uterus/adnexae: The uterus is retroverted. Right ovary
measures 3.1 x 2.5 x 2.2 cm and the left ovary measures 3.1 x 1.1 x
1.1 cm. Trace pelvic free fluid.
IMPRESSION: 1. Single live intrauterine pregnancy as above, estimated age 6
weeks and 2 days.
2. Large subchorionic hemorrhage.

## 2021-06-17 IMAGING — US US OB < 14 WEEKS - US OB TV
1 series · 13 of 28 positions shown · non-contrast
Comparison: Prior ultrasound from 07/16/2019.

CLINICAL DATA: Initial evaluation for worsening vaginal bleeding,
pain.

EXAM:
OBSTETRIC <14 WK US AND TRANSVAGINAL OB US
TECHNIQUE: Both transabdominal and transvaginal ultrasound examinations were
performed for complete evaluation of the gestation as well as the
maternal uterus, adnexal regions, and pelvic cul-de-sac.
Transvaginal technique was performed to assess early pregnancy.

[Series 1: us ob less than 14 weeks with ob transvaginal · 13 of 133 slices shown]
[im 5/133]
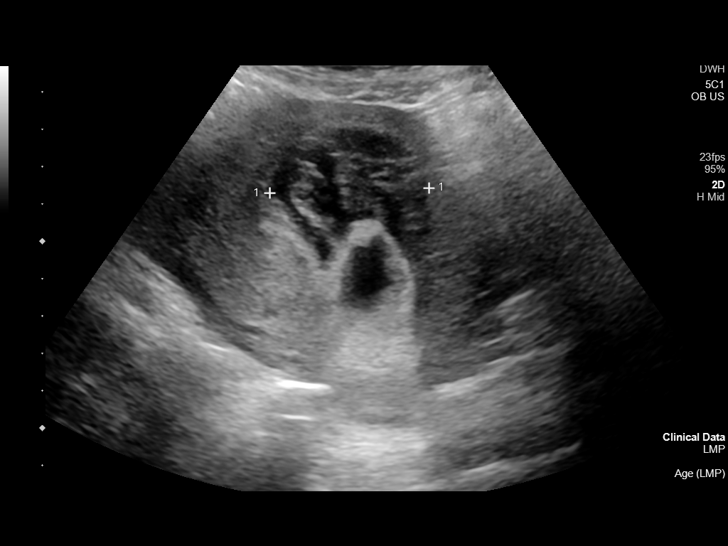
[im 15/133]
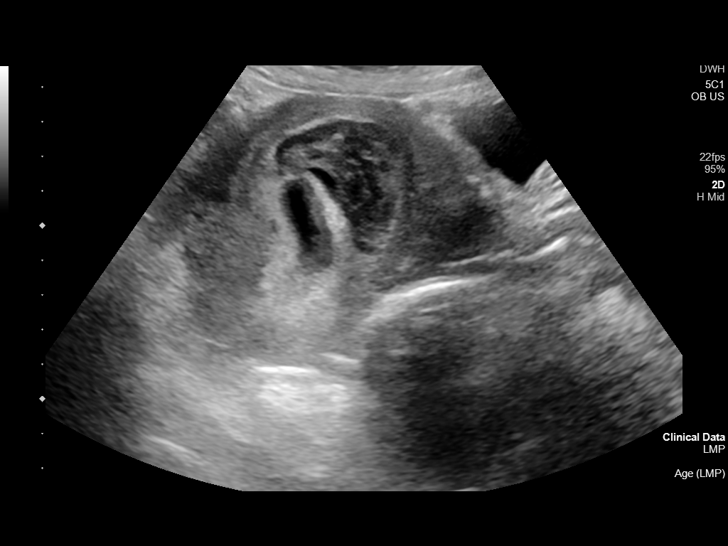
[im 25/133]
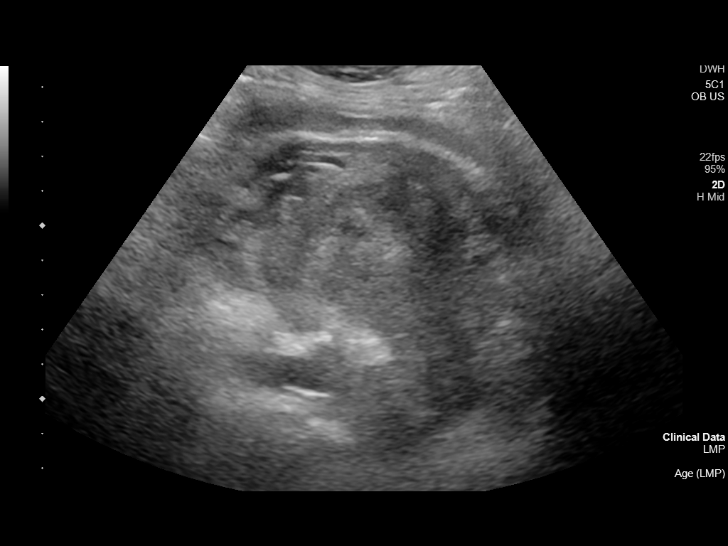
[im 35/133]
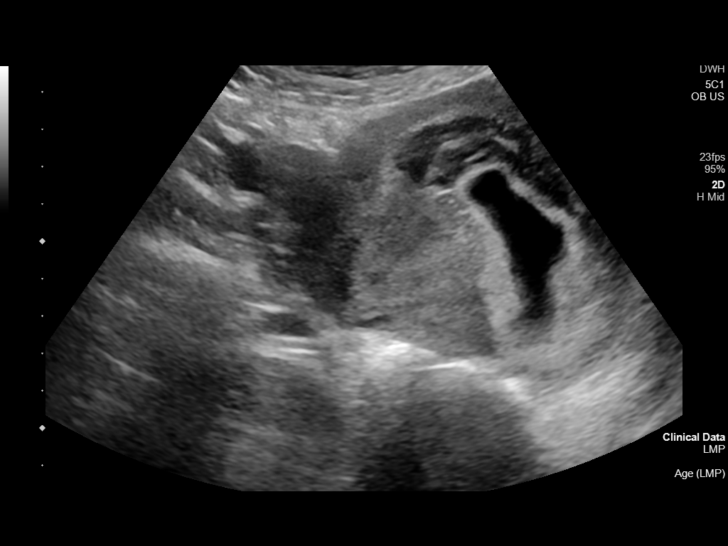
[im 45/133]
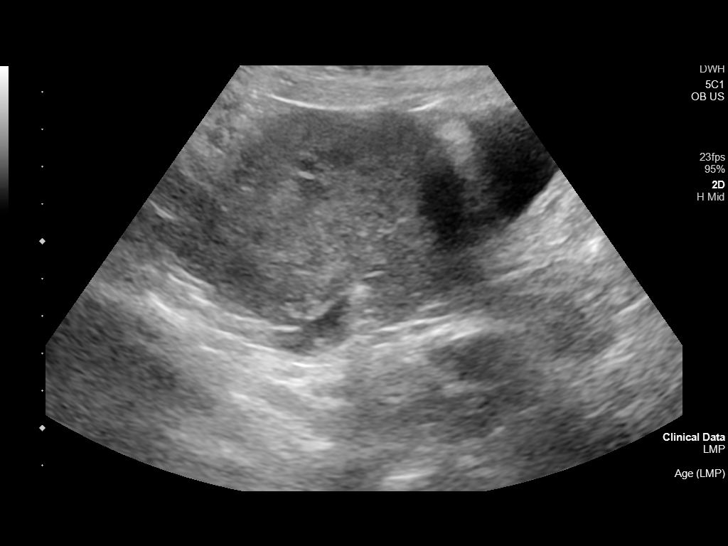
[im 54/133]
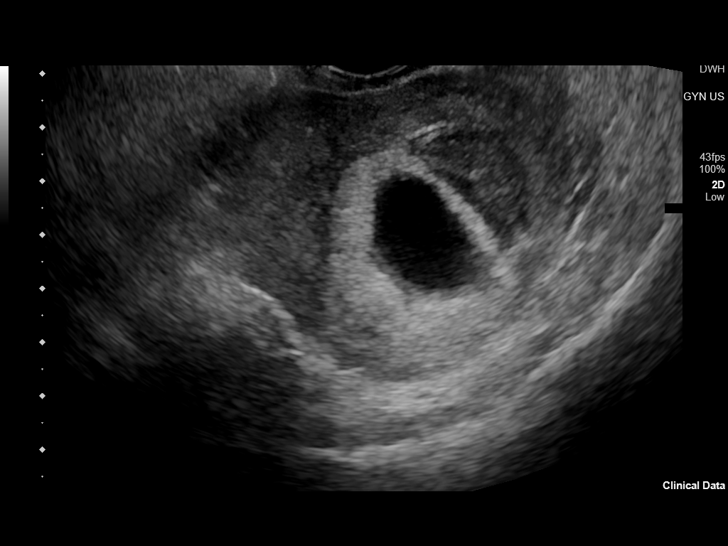
[im 69/133]
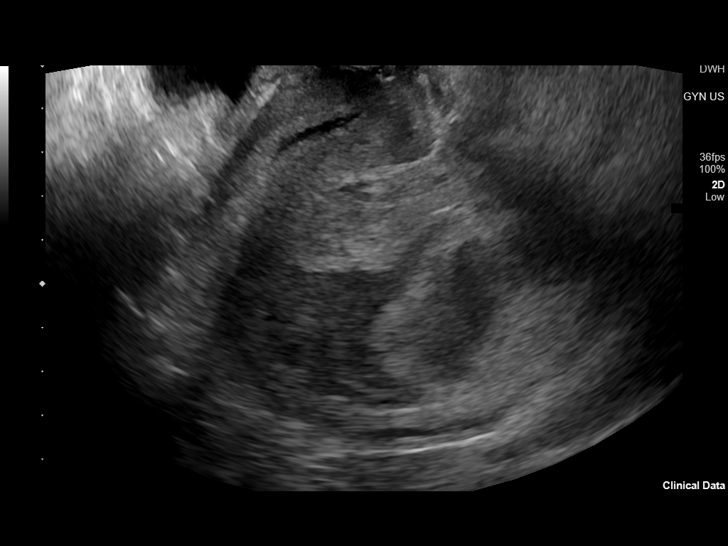
[im 79/133]
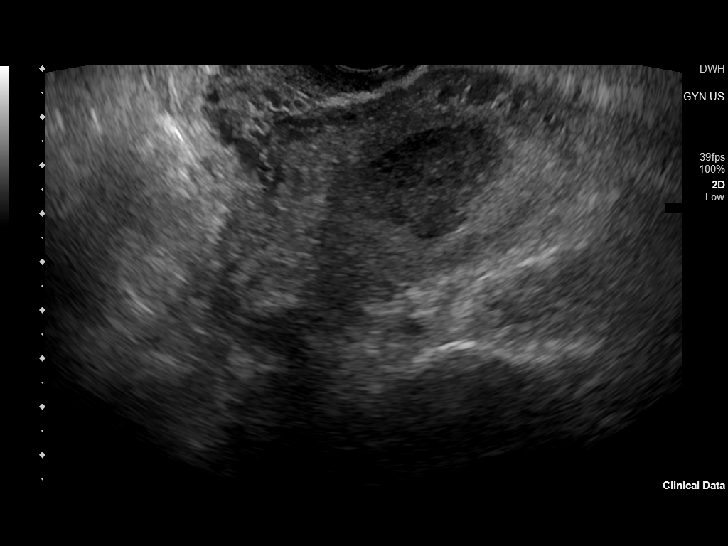
[im 89/133]
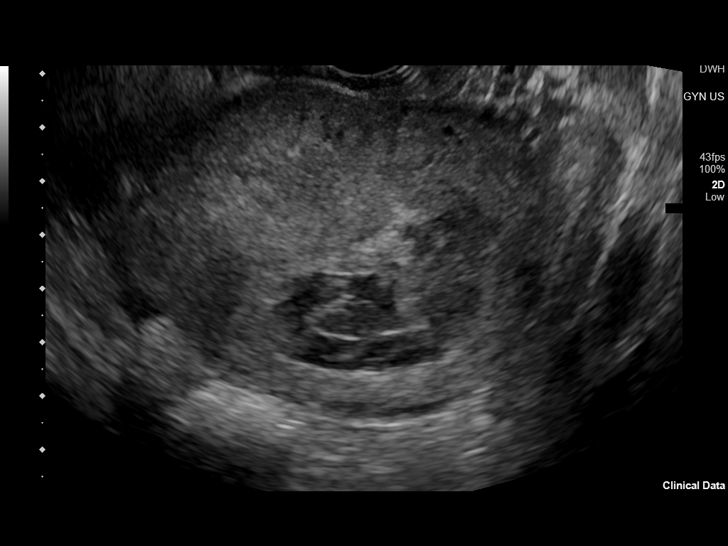
[im 98/133]
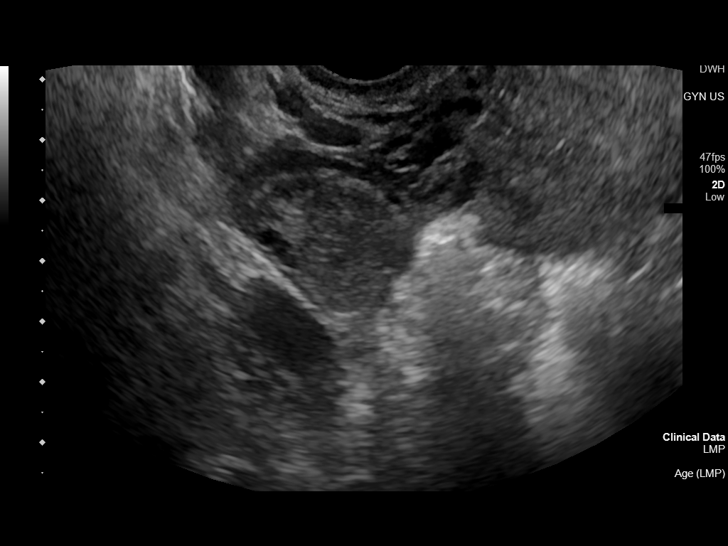
[im 108/133]
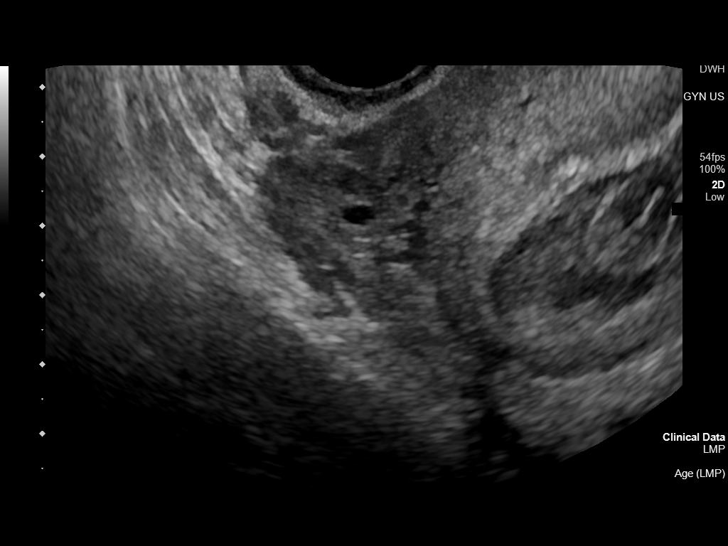
[im 118/133]
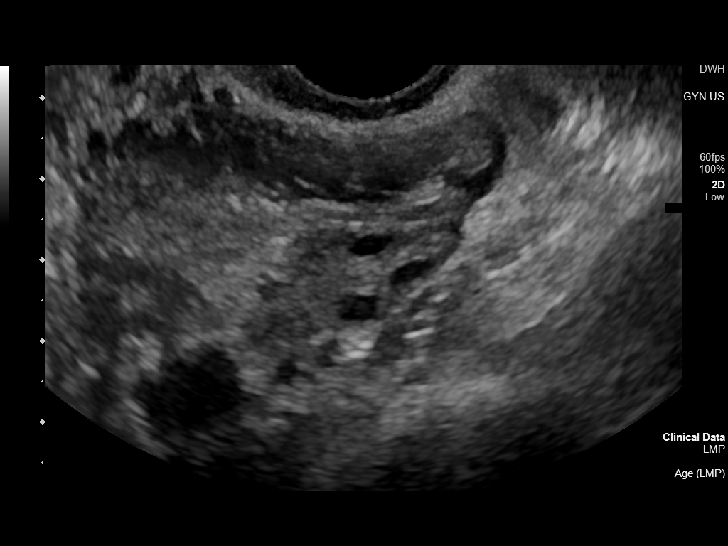
[im 128/133]
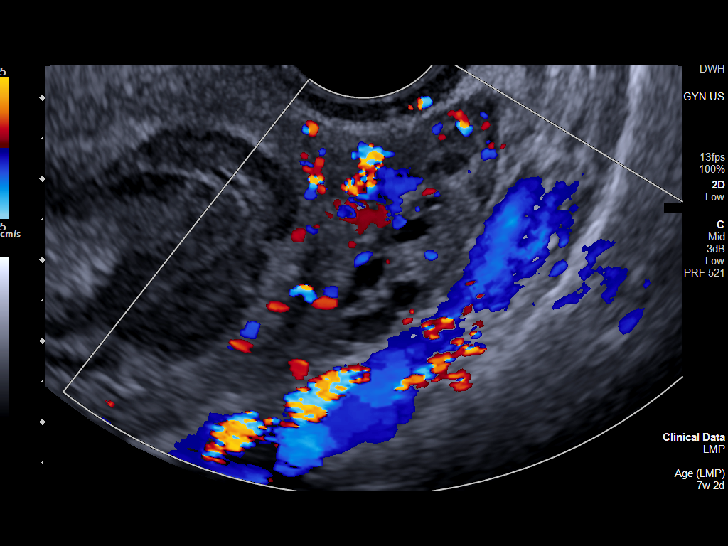

[13 of 28 positions shown; findings below may reference images not displayed]

FINDINGS: Intrauterine gestational sac: Single. The gestational sac itself is
elongated.

Yolk sac:  Present

Embryo:  Present

Cardiac Activity: Not visualized on today's exam.

CRL: 5.8 mm   6 w   3 d                  US EDC: 03/13/2020

Subchorionic hemorrhage: Large subchorionic hemorrhage measuring
x 3.3 x 6.0 cm is seen, increased in size from previous (previously
2.2 x 1.4 x 2.2 cm

Maternal uterus/adnexae: Ovaries are within normal limits. No
adnexal mass identified. Small volume free fluid present within the
pelvis.
IMPRESSION: 1. Single intrauterine pregnancy with internal yolk sac and embryo,
but no detectable cardiac activity seen on today's exam. Crown-rump
length measures 5.8 mm. Findings are suspicious for failed
pregnancy. Recommend follow-up US in 10-14 days for definitive
diagnosis. This recommendation follows SRU consensus guidelines:
Diagnostic Criteria for Nonviable Pregnancy Early in the First
Trimester. N Engl J Med 7131; [DATE].
2. Large subchorionic hemorrhage as above, increased in size from
previous.
3. No other acute maternal uterine or adnexal abnormality
identified.

## 2021-12-28 IMAGING — US US OB < 14 WEEKS - US OB TV
1 series · 14 of 28 positions shown · non-contrast
Comparison: None.

CLINICAL DATA: Lower abdominal pain

EXAM:
OBSTETRIC <14 WK US AND TRANSVAGINAL OB US
TECHNIQUE: Both transabdominal and transvaginal ultrasound examinations were
performed for complete evaluation of the gestation as well as the
maternal uterus, adnexal regions, and pelvic cul-de-sac.
Transvaginal technique was performed to assess early pregnancy.

[Series 1: us ob less than 14 weeks with ob transvaginal · 14 of 131 slices shown]
[im 5/131]
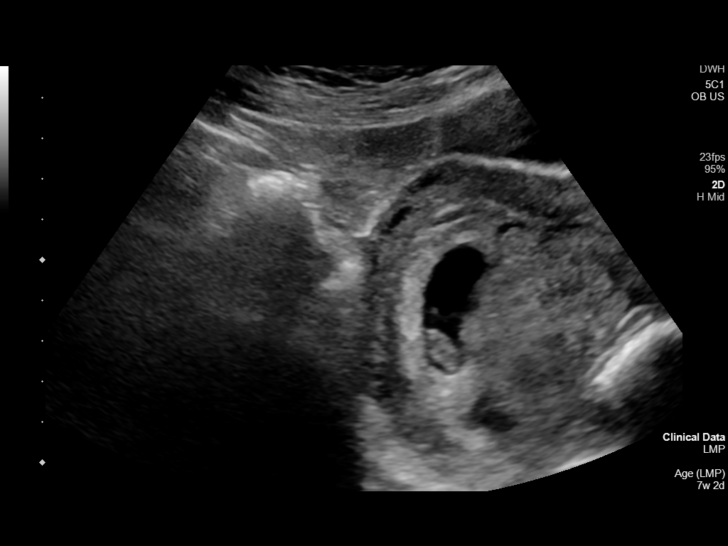
[im 15/131]
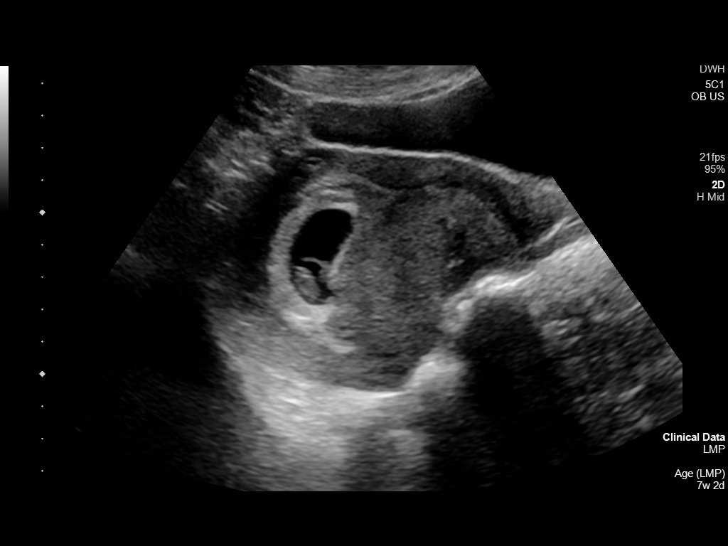
[im 25/131]
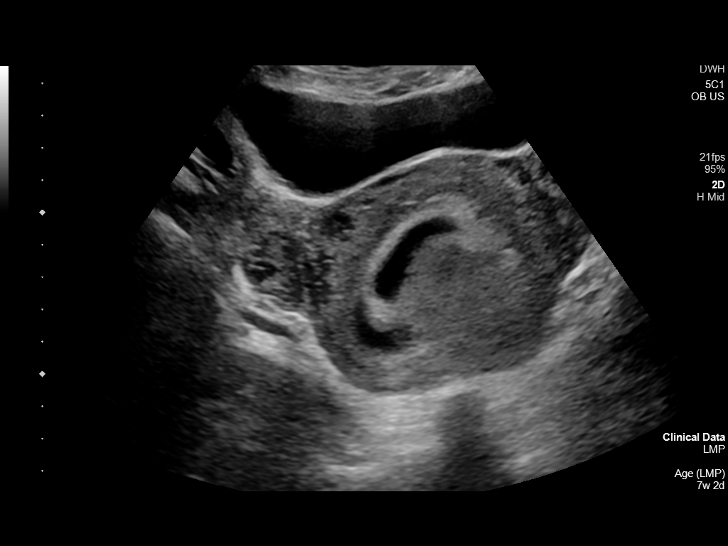
[im 34/131]
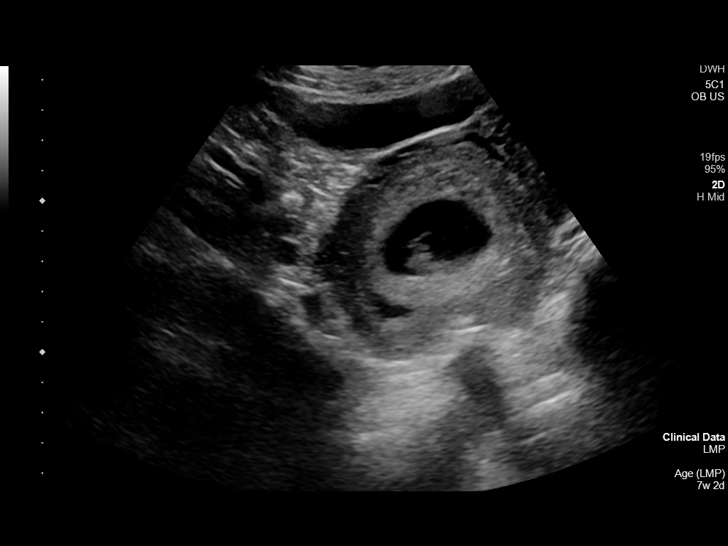
[im 44/131]
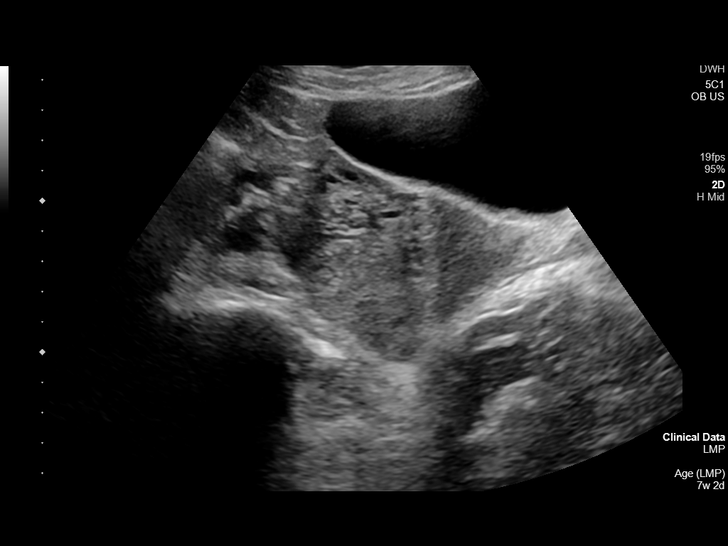
[im 53/131]
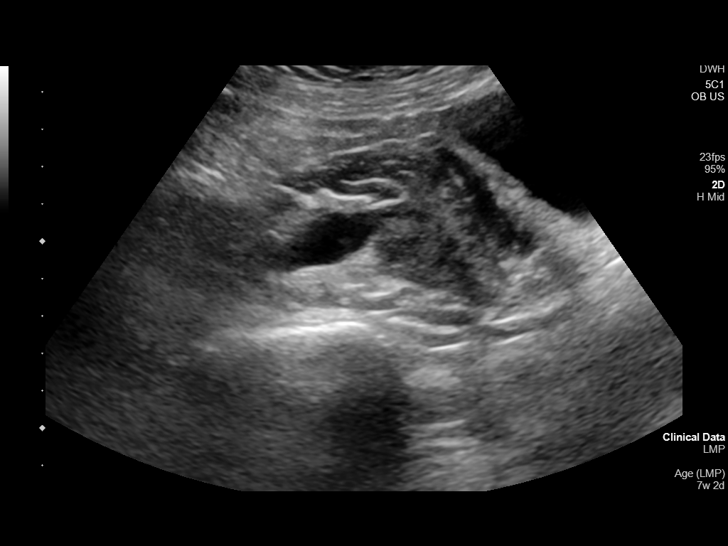
[im 63/131]
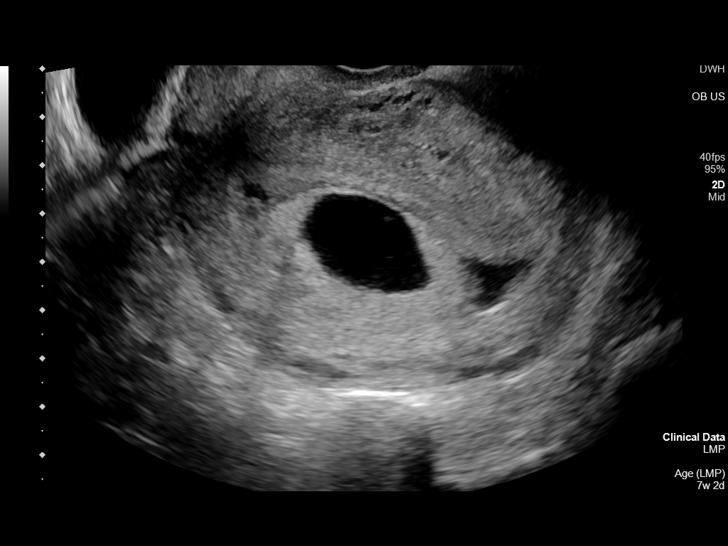
[im 73/131]
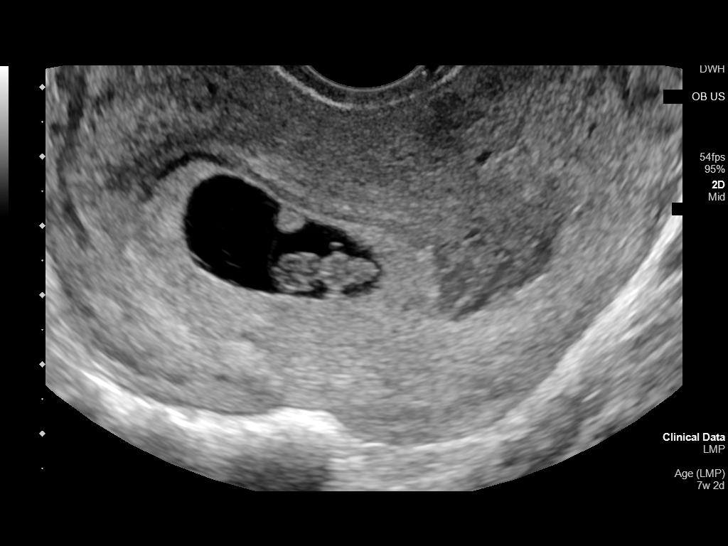
[im 82/131]
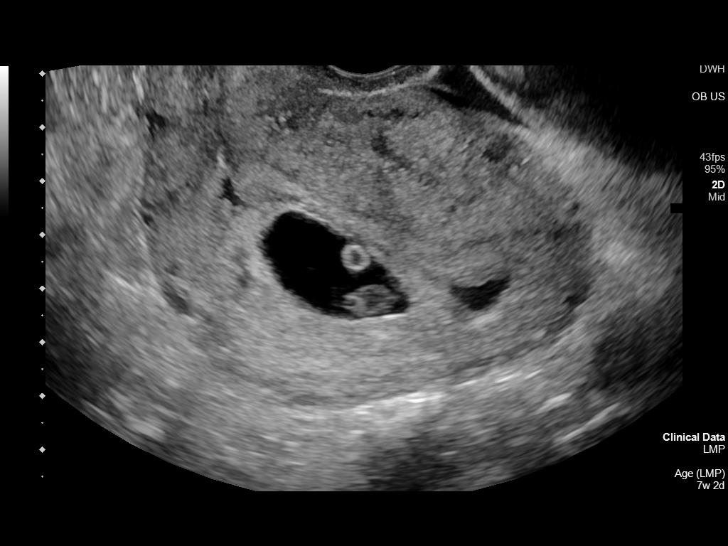
[im 92/131]
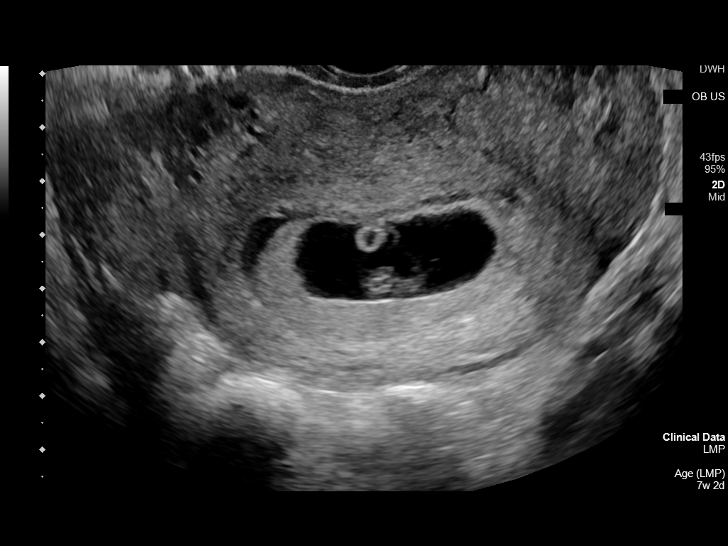
[im 102/131]
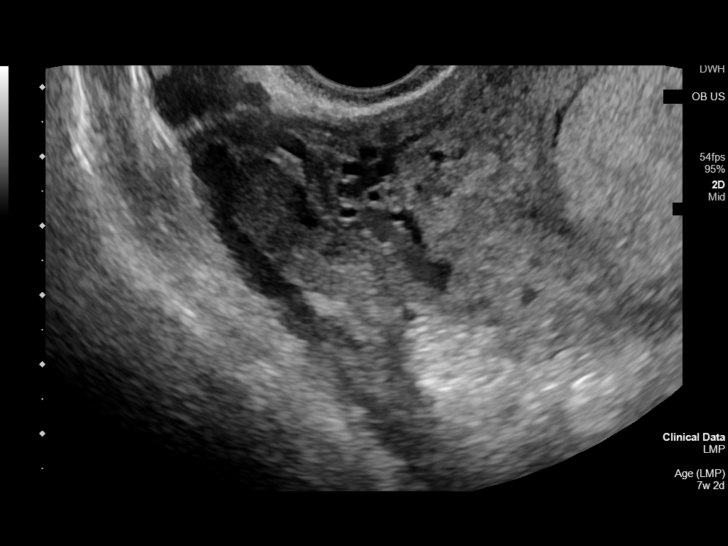
[im 111/131]
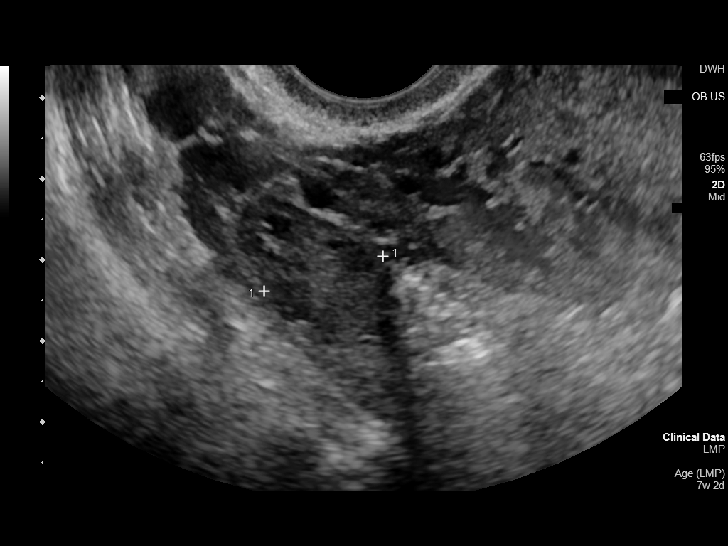
[im 121/131]
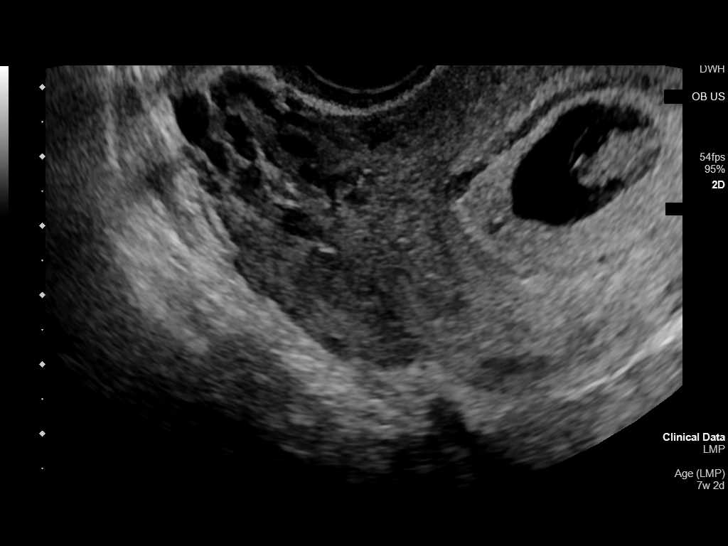
[im 131/131]
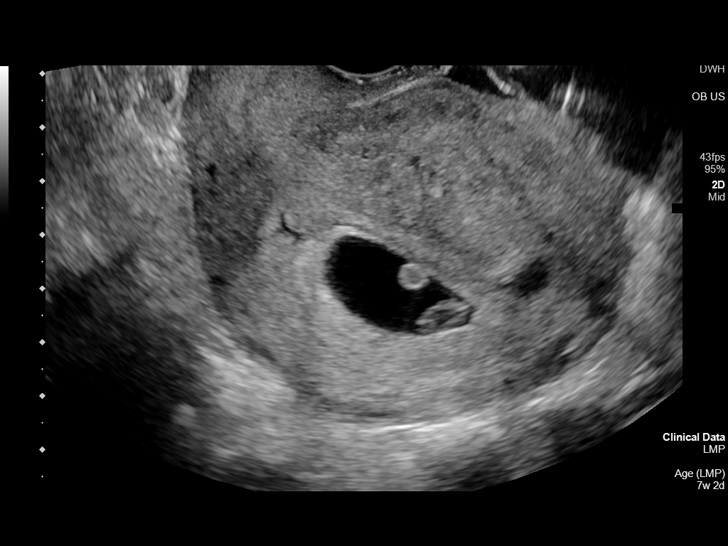

[14 of 28 positions shown; findings below may reference images not displayed]

FINDINGS: Intrauterine gestational sac: Single

Yolk sac:  Visualized.

Embryo:  Visualized.

Cardiac Activity: Visualized.

Heart Rate: 173 bpm

MSD:   mm    w     d

CRL:  14 mm   7 w   5 d                  US EDC: 09/14/2020

Subchorionic hemorrhage: 2 areas of subchorionic hemorrhage, one
small inferior to the sac and one moderate superior to the sac

Maternal uterus/adnexae: No adnexal mass or abnormal free fluid.
IMPRESSION: Seven week 5 day intrauterine pregnancy. Fetal heart rate 173 beats
per minute. 2 areas of subchorionic hemorrhage noted.

## 2022-02-16 NOTE — Progress Notes (Unsigned)
   No chief complaint on file.    History of Present Illness:  Byanka Danely Bayliss is a 37 y.o. that had a Mirena IUD placed approximately 10/16/20. Since that time, she denies dyspareunia, pelvic pain, non-menstrual bleeding, vaginal d/c, heavy bleeding.  Neg pap 10/18/20  Review of Systems  Physical Exam:  There were no vitals taken for this visit. There is no height or weight on file to calculate BMI.  Pelvic exam:  Two IUD strings {DESC; PRESENT/ABSENT:17923} seen coming from the cervical os. EGBUS, vaginal vault and cervix: within normal limits   Assessment:   No diagnosis found.  IUD strings present in proper location; pt doing well  Plan: F/u if any signs of infection or can no longer feel the strings.   Shyniece Scripter B. Tyreisha Ungar, PA-C 02/16/2022 6:06 PM

## 2022-02-17 ENCOUNTER — Encounter: Payer: Self-pay | Admitting: Obstetrics and Gynecology

## 2022-02-17 ENCOUNTER — Ambulatory Visit (INDEPENDENT_AMBULATORY_CARE_PROVIDER_SITE_OTHER): Payer: 59 | Admitting: Obstetrics and Gynecology

## 2022-02-17 VITALS — BP 100/66 | Ht 65.0 in | Wt 121.0 lb

## 2022-02-17 DIAGNOSIS — R399 Unspecified symptoms and signs involving the genitourinary system: Secondary | ICD-10-CM | POA: Diagnosis not present

## 2022-02-17 DIAGNOSIS — Z30431 Encounter for routine checking of intrauterine contraceptive device: Secondary | ICD-10-CM | POA: Diagnosis not present

## 2022-02-17 LAB — POCT URINALYSIS DIPSTICK
Bilirubin, UA: NEGATIVE
Glucose, UA: NEGATIVE
Ketones, UA: NEGATIVE
Leukocytes, UA: NEGATIVE
Nitrite, UA: NEGATIVE
Protein, UA: NEGATIVE
Spec Grav, UA: 1.02 (ref 1.010–1.025)
pH, UA: 6 (ref 5.0–8.0)

## 2022-02-17 NOTE — Patient Instructions (Signed)
I value your feedback and you entrusting us with your care. If you get a [facility] patient survey, I would appreciate you taking the time to let us know about your experience today. Thank you! ? ? ?

## 2022-02-21 LAB — URINE CULTURE

## 2022-02-23 MED ORDER — NITROFURANTOIN MONOHYD MACRO 100 MG PO CAPS: 100.0000 mg | ORAL_CAPSULE | Freq: Two times a day (BID) | ORAL | 0 refills | Status: DC

## 2022-02-23 NOTE — Progress Notes (Signed)
Pls use interpreter line to tell pt C&S shows UTI and I sent abx in. F/u prn sx. Thx

## 2022-02-23 NOTE — Addendum Note (Signed)
Addended by: Ardeth Perfect B on: 0/0/7121 04:33 PM   Modules accepted: Orders

## 2022-03-30 IMAGING — US US OB COMP +14 WK
1 series · 13 of 28 positions shown · non-contrast
Comparison: none

CLINICAL DATA: Fetal anatomic survey

EXAM:
OBSTETRICAL ULTRASOUND >14 WKS

[Series 1: us ob comp + 14 wk · 13 of 102 slices shown]
[im 4/102]
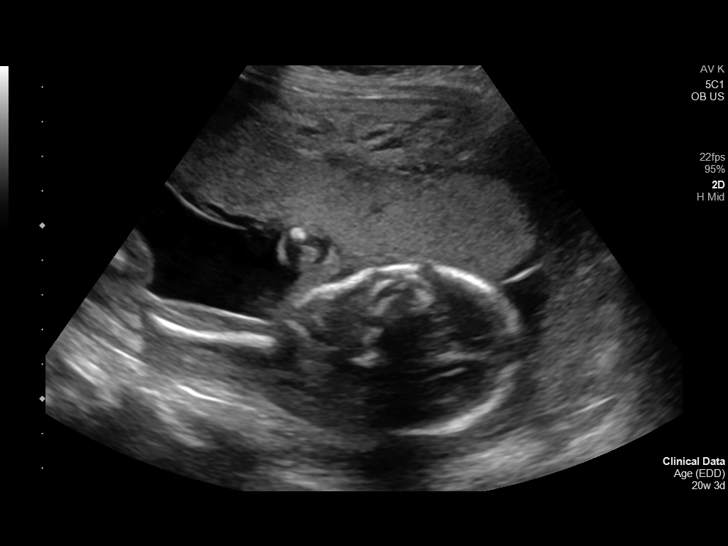
[im 12/102]
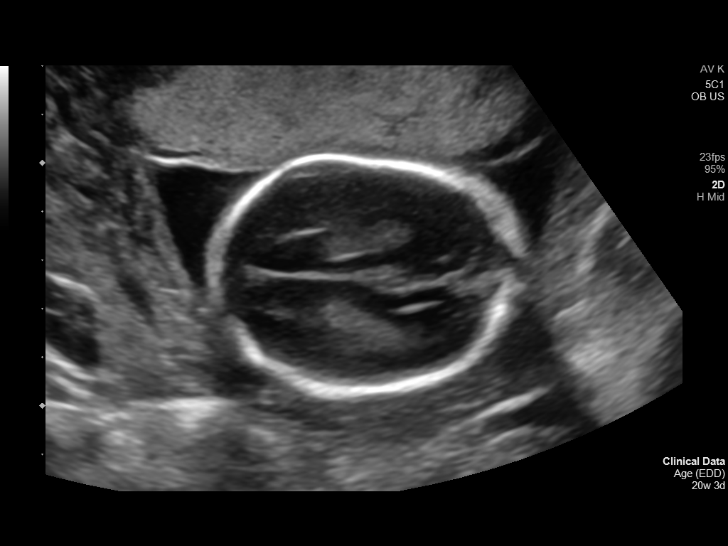
[im 19/102]
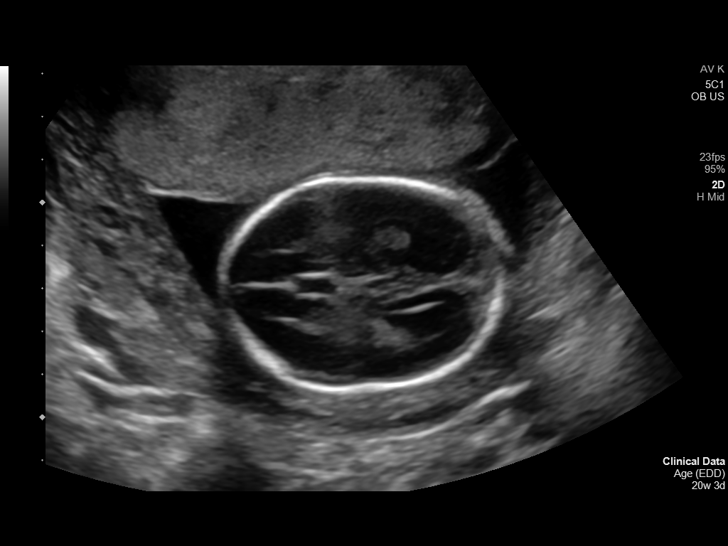
[im 27/102]
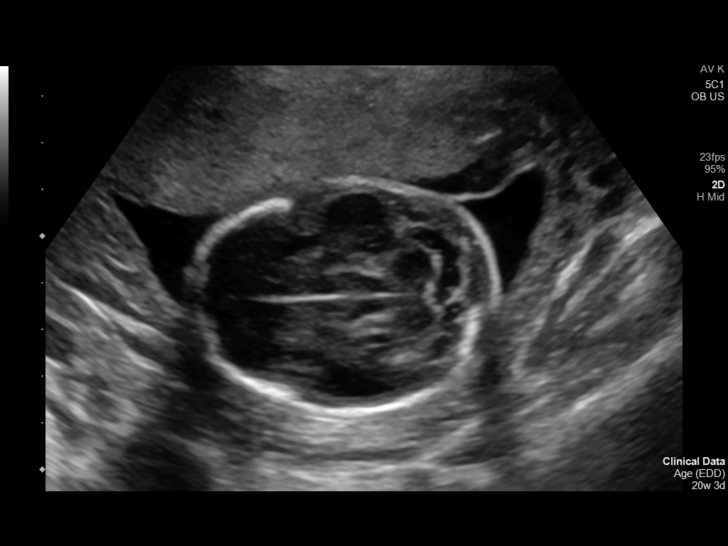
[im 34/102]
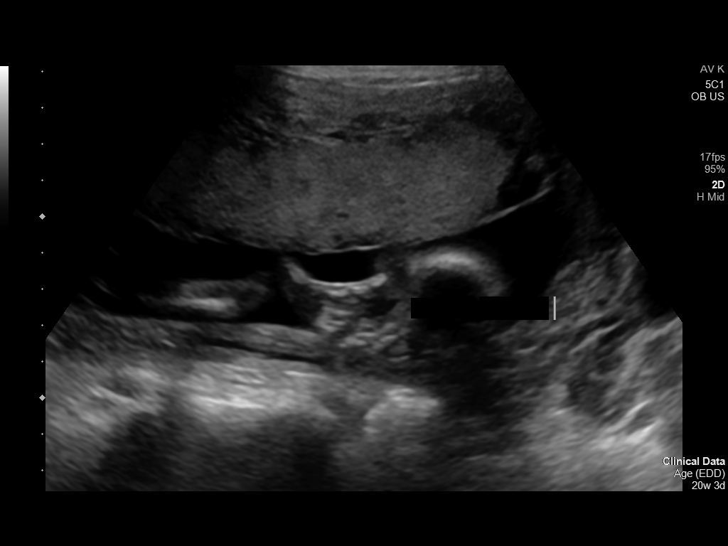
[im 42/102]
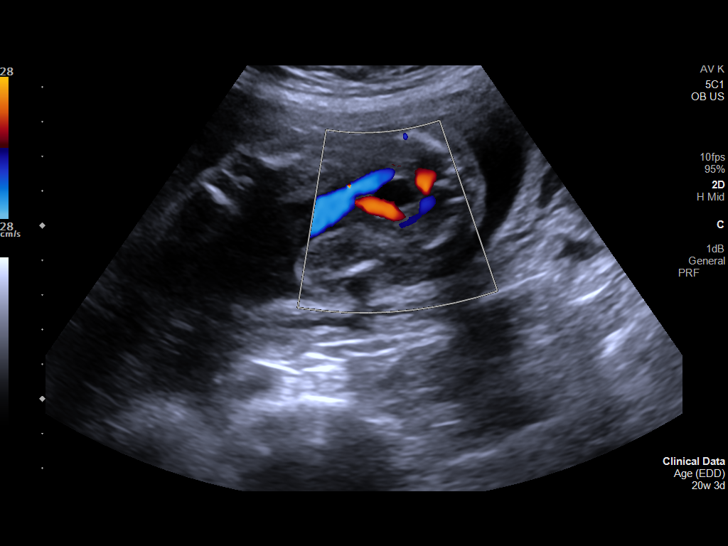
[im 53/102]
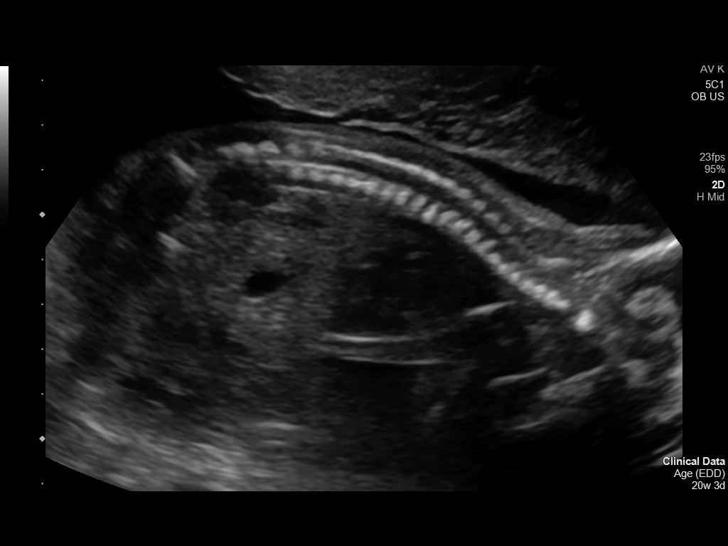
[im 60/102]
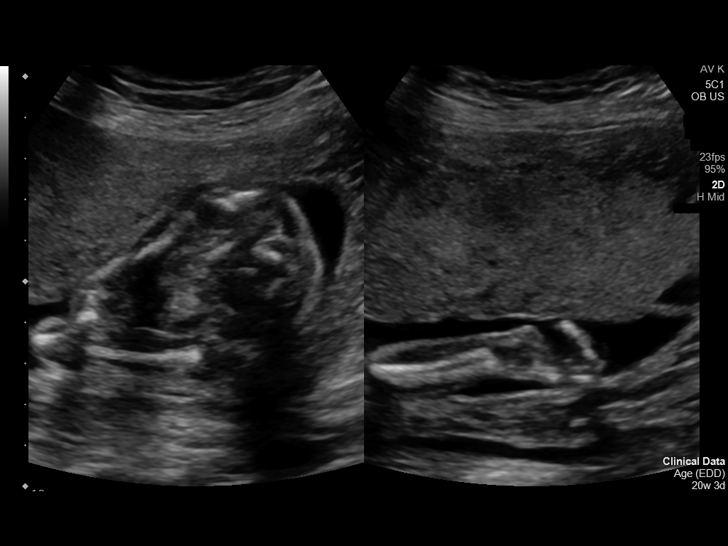
[im 68/102]
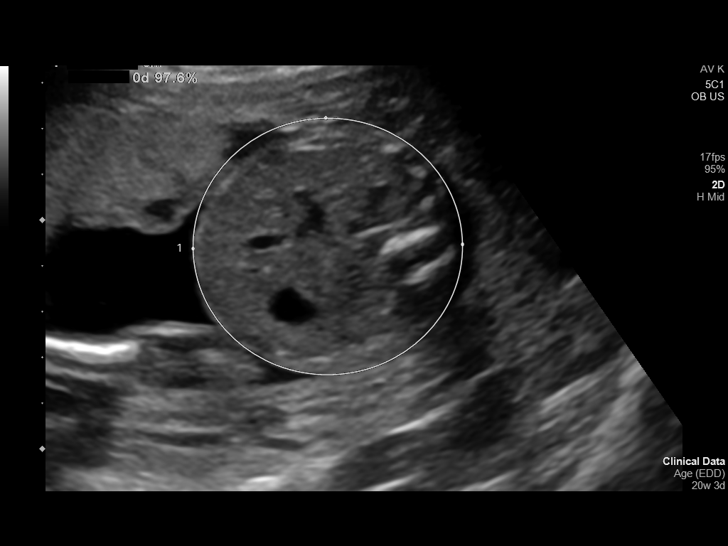
[im 75/102]
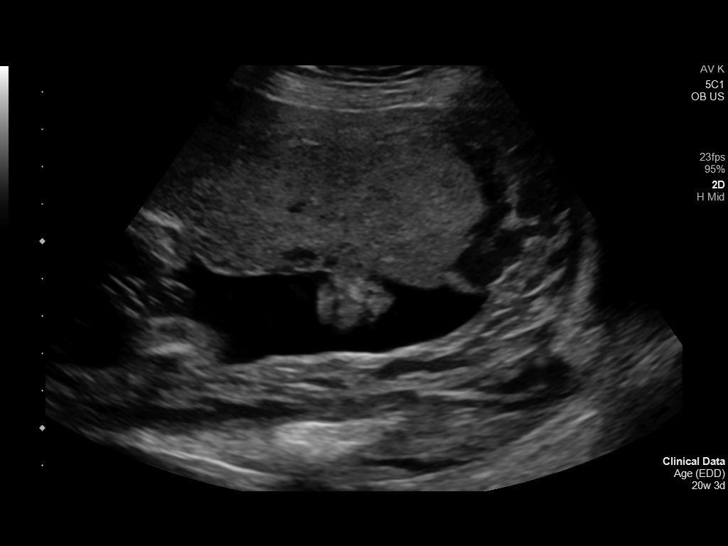
[im 83/102]
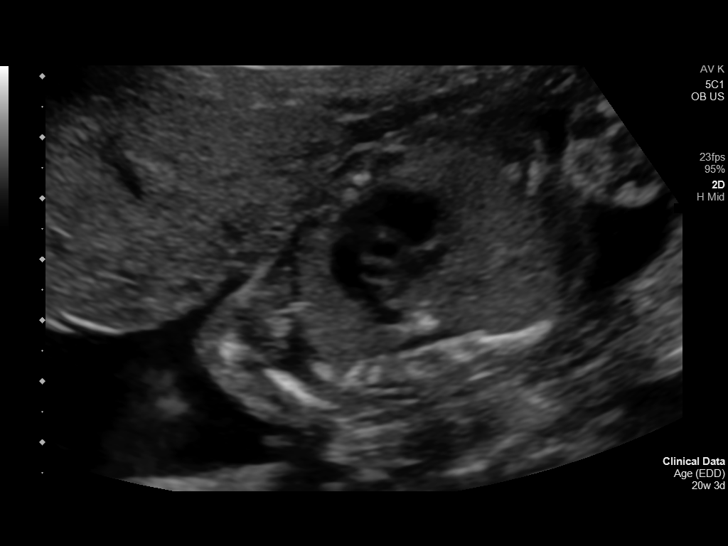
[im 90/102]
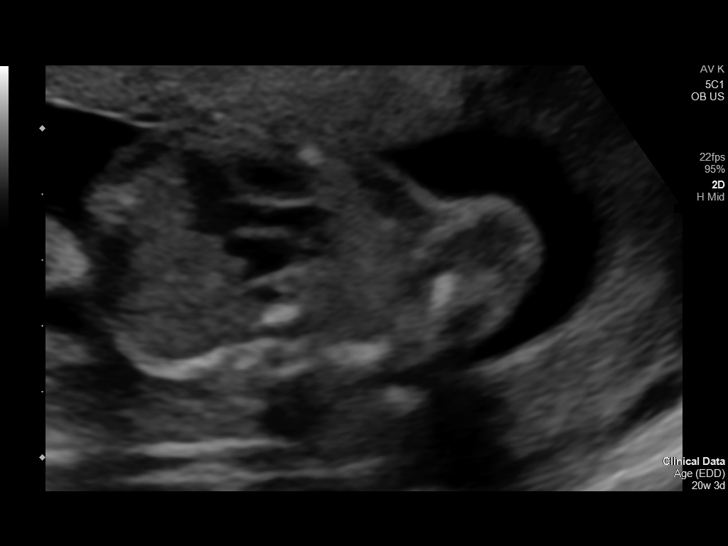
[im 98/102]
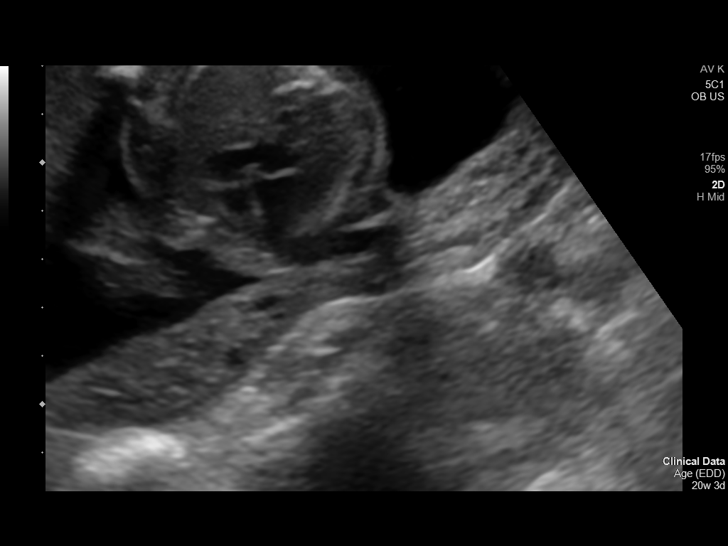

[13 of 28 positions shown; findings below may reference images not displayed]

FINDINGS: Number of Fetuses: 1

Heart Rate:  145 bpm

Movement: Yes

Presentation: Cephalic

Previa: No

Placental Location: Anterior

Amniotic Fluid (Subjective): Normal

Amniotic Fluid (Objective):

Vertical pocket = 4.0cm

FETAL BIOMETRY

BPD: 5.0cm 21w 0d

HC:   18.8cm 21w 1d

AC:   16.9cm 21w 6d

FL:   3.6cm 21w 3d

Current Mean GA: 21w 2d US EDC: 09/11/2020

Assigned GA:  20w 3d Assigned EDC: 09/17/2020

FETAL ANATOMY

Lateral Ventricles: Appears normal

Thalami/CSP: Appears normal

Posterior Fossa:  Appears normal

Nuchal Region: Appears normal   NFT= N/A > 20 WKS

Upper Lip: Appears normal

Spine: Appears normal

4 Chamber Heart on Left: Appears normal

LVOT: Appears normal

RVOT: Appears normal

Stomach on Left: Appears normal

3 Vessel Cord: Appears normal

Cord Insertion site: Appears normal

Kidneys: Appears normal

Bladder: Appears normal

Extremities: Appears normal

Sex: Male

Technically difficult due to: None

Maternal Findings:

Cervix:  4.5 cm
IMPRESSION: 1. Single live intrauterine pregnancy as above, estimated age 21
weeks and 2 days.
2. Normal fetal anatomic survey.

## 2022-12-22 ENCOUNTER — Other Ambulatory Visit: Payer: Self-pay

## 2022-12-22 ENCOUNTER — Emergency Department
Admission: EM | Admit: 2022-12-22 | Discharge: 2022-12-22 | Disposition: A | Discharge status: Home / Self Care | Payer: 59 | Attending: Emergency Medicine | Admitting: Emergency Medicine

## 2022-12-22 ENCOUNTER — Emergency Department: Payer: 59

## 2022-12-22 DIAGNOSIS — G43009 Migraine without aura, not intractable, without status migrainosus: Secondary | ICD-10-CM | POA: Insufficient documentation

## 2022-12-22 DIAGNOSIS — R519 Headache, unspecified: Secondary | ICD-10-CM | POA: Diagnosis present

## 2022-12-22 MED ORDER — ACETAMINOPHEN 500 MG PO TABS
1000.0000 mg | ORAL_TABLET | Freq: Once | ORAL | Status: AC
  Administered 2022-12-22: 1000 mg via ORAL
  Filled 2022-12-22: qty 2

## 2022-12-22 MED ORDER — ONDANSETRON 4 MG PO TBDP
4.0000 mg | ORAL_TABLET | Freq: Once | ORAL | Status: AC
  Administered 2022-12-22: 4 mg via ORAL
  Filled 2022-12-22: qty 1

## 2022-12-22 MED ORDER — KETOROLAC TROMETHAMINE 30 MG/ML IJ SOLN
30.0000 mg | Freq: Once | INTRAMUSCULAR | Status: AC
  Administered 2022-12-22: 30 mg via INTRAMUSCULAR
  Filled 2022-12-22: qty 1

## 2022-12-22 MED ORDER — EXCEDRIN MIGRAINE 250-250-65 MG PO TABS: 1.0000 | ORAL_TABLET | Freq: Three times a day (TID) | ORAL | 0 refills | Status: DC | PRN

## 2022-12-22 NOTE — ED Triage Notes (Signed)
Pt sts that she has been having left sided headache for the last week. Pt was sent over from [facility] for a further work up.

## 2022-12-22 NOTE — Discharge Instructions (Signed)
Your evaluated in the ED for headache.  Your CT scan was normal.  Please pick up medication from pharmacy if symptoms do not improve follow-up with neurology that is attached to your discharge papers.

## 2022-12-22 NOTE — ED Provider Notes (Signed)
[facility] Emergency Department Provider Note     Event Date/Time   First MD Initiated Contact with Patient 12/22/22 1218     (approximate)   History   Headache   HPI  Stacie Garrison is a 37 y.o. female with no significant past medical history presents to the ED for evaluation of a headache x 1 week after being evaluated at [facility] who recommended further workup in the ED.  She reports associated symptoms include nausea, dizziness and visual blurriness.  She denies fever, sick contacts and vomiting.  She also denies loss of consciousness or near syncope.  She reports she has never experienced a headache like this before.  She has taken Tylenol with minimal relief.  Denies neck stiffness, trauma, chest pain and shortness of breath.     Physical Exam   Triage Vital Signs: ED Triage Vitals  Encounter Vitals Group     BP 12/22/22 1155 117/79     Systolic BP Percentile --      Diastolic BP Percentile --      Pulse Rate 12/22/22 1155 65     Resp 12/22/22 1155 17     Temp 12/22/22 1155 97.8 F (36.6 C)     Temp Source 12/22/22 1155 Oral     SpO2 12/22/22 1155 98 %     Weight 12/22/22 1154 121 lb (54.9 kg)     Height 12/22/22 1154 5\' 5"  (1.651 m)     Head Circumference --      Peak Flow --      Pain Score 12/22/22 1154 2     Pain Loc --      Pain Education --      Exclude from Growth Chart --     Most recent vital signs: Vitals:   12/22/22 1155  BP: 117/79  Pulse: 65  Resp: 17  Temp: 97.8 F (36.6 C)  SpO2: 98%    General: Well appearing. Alert and oriented. INAD.  Skin:  Warm, dry and intact. No rashes or lesions noted.     Head:  NCAT.  Eyes:  PERRLA. EOMI.  Neck:   No cervical spine tenderness to palpation. Full ROM without difficulty.  CV:  Good peripheral perfusion. RRR.  RESP:  Normal effort. LCTAB.   ABD:  No distention. Soft, Non tender.  NEURO: Cranial nerves II-XII intact. No focal deficits. Sensation and motor function intact. 5/5  muscle strength of UE & LE. Gait is steady.   ED Results / Procedures / Treatments   Labs (all labs ordered are listed, but only abnormal results are displayed) Labs Reviewed - No data to display  RADIOLOGY  I personally viewed and evaluated these images as part of my medical decision making, as well as reviewing the written report by the radiologist.  ED Provider Interpretation: CT head appears normal.  No masses noted  CT Head Wo Contrast  Result Date: 12/22/2022 CLINICAL DATA:  Left-sided headache for the last week EXAM: CT HEAD WITHOUT CONTRAST TECHNIQUE: Contiguous axial images were obtained from the base of the skull through the vertex without intravenous contrast. RADIATION DOSE REDUCTION: This exam was performed according to the departmental dose-optimization program which includes automated exposure control, adjustment of the mA and/or kV according to patient size and/or use of iterative reconstruction technique. COMPARISON:  None Available. FINDINGS: Brain: No evidence of acute infarction, hemorrhage, mass, mass effect, or midline shift. No hydrocephalus or extra-axial fluid collection. Vascular: No hyperdense vessel. Skull: Negative  for fracture or focal lesion. Sinuses/Orbits: Mild mucosal thickening in the ethmoid air cells. No acute finding in the orbits. Other: The mastoid air cells are well aerated. IMPRESSION: No acute intracranial process. Electronically Signed   By: Wiliam Ke M.D.   On: 12/22/2022 13:06    PROCEDURES:  Critical Care performed: No  Procedures   MEDICATIONS ORDERED IN ED: Medications  ketorolac (TORADOL) 30 MG/ML injection 30 mg (30 mg Intramuscular Given 12/22/22 1304)  acetaminophen (TYLENOL) tablet 1,000 mg (1,000 mg Oral Given 12/22/22 1303)  ondansetron (ZOFRAN-ODT) disintegrating tablet 4 mg (4 mg Oral Given 12/22/22 1305)     IMPRESSION / MDM / ASSESSMENT AND PLAN / ED COURSE  I reviewed the triage vital signs and the nursing notes.                                  37 y.o. female presents to the emergency department for evaluation and treatment of headache. See HPI for further details.   Differential diagnosis includes, but is not limited to migraine, headache, intracranial mass, meningitis, temporal arteritis.  Patient's presentation is most consistent with acute presentation with potential threat to life or bodily function.  Patient is alert and oriented.  She is hemodynamically stable and well-appearing.  Neuroexam is reassuring.  No neurodeficits.  CT scan head is reassuring.  Pain management with migraine cocktail.  Patient is a stable condition for discharge home.  Encouraged to follow-up with neurosurgery if symptoms do not improve in 1 week.  Patient verbalized understanding.  ED precautions discussed.  FINAL CLINICAL IMPRESSION(S) / ED DIAGNOSES   Final diagnoses:  Migraine without aura and without status migrainosus, not intractable   Rx / DC Orders   ED Discharge Orders          Ordered    aspirin-acetaminophen-caffeine (EXCEDRIN MIGRAINE) 250-250-65 MG tablet  Every 8 hours PRN        12/22/22 1319            Note:  This document was prepared using Dragon voice recognition software and may include unintentional dictation errors.    Kern Reap A, PA-C 12/22/22 1341    Jene Every, MD 12/22/22 [phone]

## 2022-12-22 NOTE — ED Notes (Signed)
See triage notes. Patient c/o left sided headache for the past week. Patient was seen this morning at [facility].

## 2022-12-26 ENCOUNTER — Telehealth: Payer: Self-pay | Admitting: Neurosurgery

## 2022-12-26 NOTE — Telephone Encounter (Signed)
Request for evaluation from ED, okay in APP clinic 12/22/2022 Lovenia Kim, MD  P [facility]Neurosurgery Admin  Patient was left a message using [employer].

## 2022-12-26 NOTE — Telephone Encounter (Signed)
"  Encouraged to follow-up with neurosurgery if symptoms do not improve in 1 week."

## 2023-02-02 ENCOUNTER — Ambulatory Visit
Admission: EM | Admit: 2023-02-02 | Discharge: 2023-02-02 | Disposition: A | Discharge status: Home / Self Care | Payer: Medicaid Other | Attending: Emergency Medicine | Admitting: Emergency Medicine

## 2023-02-02 DIAGNOSIS — R300 Dysuria: Secondary | ICD-10-CM | POA: Diagnosis not present

## 2023-02-02 LAB — POCT URINALYSIS DIP (MANUAL ENTRY)
Bilirubin, UA: NEGATIVE
Glucose, UA: NEGATIVE mg/dL
Ketones, POC UA: NEGATIVE mg/dL
Nitrite, UA: NEGATIVE
Protein Ur, POC: NEGATIVE mg/dL
Spec Grav, UA: 1.025
Urobilinogen, UA: 0.2 U/dL
pH, UA: 7

## 2023-02-02 MED ORDER — NITROFURANTOIN MONOHYD MACRO 100 MG PO CAPS: 100.0000 mg | ORAL_CAPSULE | Freq: Two times a day (BID) | ORAL | 0 refills | Status: DC

## 2023-02-02 MED ORDER — PHENAZOPYRIDINE HCL 200 MG PO TABS: 200.0000 mg | ORAL_TABLET | Freq: Three times a day (TID) | ORAL | 0 refills | Status: DC

## 2023-02-02 NOTE — Discharge Instructions (Signed)
 Phn tch n??c ti?u c?a b?n hi?n cho th?y nhi?m trng, n??c ti?u c?a b?n s? ???c g?i ??n phng th nghi?m ?? xc ??nh chnh xc vi khu?n no hi?n di?n, n?u c b?t k? thay ??i no c?n th?c hi?n ??i v?i thu?c c?a b?n, b?n s? ???c thng bo  B?t ??u s? d?ng Macrobid  vo m?i bu?i sng v m?i bu?i t?i trong 5 ngy v b?n hi?n ?ang c cc tri?u ch?ng  B?n c th? s? d?ng Pyridium  c? sau 8 gi? n?u c?n ?? gi?m c?m gic nng rt, ?i?u ny s? khi?n n??c ti?u c?a b?n c mu cam  T?ng l??ng ch?t l?ng c?a b?n thng qua vi?c s? d?ng n??c  Lun lun th?c hnh v? sinh t?t, lau t? tr??c ra sau v trnh cc s?n ph?m m ??o c mi th?m ?? trnh b? kch ?ng thm  N?u cc tri?u ch?ng ti?p t?c t?n t?i sau khi s? d?ng thu?c ho?c ti pht, vui lng lin h? v?i c? s? ch?m [place] kh?n c?p ho?c bc s? chnh c?a b?n n?u c?n.    Your urinalysis currently show infection, your urine will be sent to the lab to determine exactly which bacteria is present, if any changes need to be made to your medications you will be notified  Begin use of Macrobid  every morning and every evening for 5 days as you are currently having symptoms  You may use Pyridium  every 8 hours as needed to help with burning sensation, this will turn your urine orange  Increase your fluid intake through use of water  As always practice good hygiene, wiping front to back and avoidance of scented vaginal products to prevent further irritation  If symptoms continue to persist after use of medication or recur please follow-up with urgent care or your primary doctor as needed

## 2023-02-02 NOTE — ED Provider Notes (Signed)
 Stacie Garrison    CSN: [id number] Arrival date & time: 02/02/23  9056      History   Chief Complaint Chief Complaint  Patient presents with   Dysuria    HPI Stacie Garrison is a 38 y.o. female.   Patient presents for evaluation of dysuria, urinary frequency, intermittent lower back pain and abdominal pain that began 7 days ago.  Has not attempted treatment.  Denies hematuria, vaginal symptoms, fever.  Past Medical History:  Diagnosis Date   COVID-19 02/02/2020   Symptomatic since about 01/29/2020, tested + in [facility] ED on 02/02/2020   Medical history non-contributory     Patient Active Problem List   Diagnosis Date Noted   Prolonged spontaneous rupture of membranes 08/31/2020   Supervision of other normal pregnancy, antepartum 02/28/2020   COVID-19 affecting pregnancy in first trimester 02/02/2020    Past Surgical History:  Procedure Laterality Date   APPENDECTOMY      OB History     Gravida  4   Para  2   Term  2   Preterm      AB  2   Living  2      SAB  1   IAB      Ectopic      Multiple  0   Live Births  1            Home Medications    Prior to Admission medications   Medication Sig Start Date End Date Taking? Authorizing Provider  nitrofurantoin , macrocrystal-monohydrate, (MACROBID ) 100 MG capsule Take 1 capsule (100 mg total) by mouth 2 (two) times daily. 02/02/23  Yes Shanine Kreiger R, NP  phenazopyridine  (PYRIDIUM ) 200 MG tablet Take 1 tablet (200 mg total) by mouth 3 (three) times daily. 02/02/23  Yes Brandis Wixted, Shelba SAUNDERS, NP  aspirin-acetaminophen -caffeine (EXCEDRIN  MIGRAINE) 250-250-65 MG tablet Take 1 tablet by mouth every 8 (eight) hours as needed for headache. 12/22/22   Margrette, Myah A, PA-C  levonorgestrel  (MIRENA ) 20 MCG/DAY IUD 1 each by Intrauterine route once.    [provider]    Family History Family History  Problem Relation Age of Onset   Hypertension Maternal Grandmother    Heart Problems Maternal  Grandmother    Hypertension Mother     Social History Social History   Tobacco Use   Smoking status: Never   Smokeless tobacco: Never  Substance Use Topics   Alcohol use: No   Drug use: No     Allergies   Patient has no known allergies.   Review of Systems Review of Systems   Physical Exam Triage Vital Signs ED Triage Vitals  Encounter Vitals Group     BP 02/02/23 0955 101/69     Systolic BP Percentile --      Diastolic BP Percentile --      Pulse Rate 02/02/23 0955 77     Resp 02/02/23 0955 16     Temp 02/02/23 0955 97.8 F (36.6 C)     Temp Source 02/02/23 0955 Oral     SpO2 02/02/23 0955 99 %     Weight --      Height --      Head Circumference --      Peak Flow --      Pain Score 02/02/23 1018 0     Pain Loc --      Pain Education --      Exclude from Growth Chart --    No data  found.  Updated Vital Signs BP 101/69 (BP Location: Left Arm)   Pulse 77   Temp 97.8 F (36.6 C) (Oral)   Resp 16   SpO2 99%   Visual Acuity Right Eye Distance:   Left Eye Distance:   Bilateral Distance:    Right Eye Near:   Left Eye Near:    Bilateral Near:     Physical Exam Constitutional:      Appearance: Normal appearance.  Eyes:     Extraocular Movements: Extraocular movements intact.  Pulmonary:     Effort: Pulmonary effort is normal.  Abdominal:     Tenderness: There is no right CVA tenderness or left CVA tenderness.  Neurological:     Mental Status: She is alert and oriented to person, place, and time. Mental status is at baseline.      UC Treatments / Results  Labs (all labs ordered are listed, but only abnormal results are displayed) Labs Reviewed  POCT URINALYSIS DIP (MANUAL ENTRY) - Abnormal; Notable for the following components:      Result Value   Blood, UA trace-intact (*)    Leukocytes, UA Trace (*)    All other components within normal limits  URINE CULTURE    EKG   Radiology No results found.  Procedures Procedures  (including critical care time)  Medications Ordered in UC Medications - No data to display  Initial Impression / Assessment and Plan / UC Course  I have reviewed the triage vital signs and the nursing notes.  Pertinent labs & imaging results that were available during my care of the patient were reviewed by me and considered in my medical decision making (see chart for details).  Dysuria  Urinalysis showing leukocytes, negative for nitrates, sent for culture, prophylactically placed on antibiotic she is symptomatic, prescribed Macrobid  and Pyridium  for comfort, recommended additional supportive measures with follow-up as needed  [place]namese interpreter used Final Clinical Impressions(s) / UC Diagnoses   Final diagnoses:  Dysuria     Discharge Instructions      Phn tch n??c ti?u c?a b?n hi?n cho th?y nhi?m trng, n??c ti?u c?a b?n s? ???c g?i ??n phng th nghi?m ?? xc ??nh chnh xc vi khu?n no hi?n di?n, n?u c b?t k? thay ??i no c?n th?c hi?n ??i v?i thu?c c?a b?n, b?n s? ???c thng bo  B?t ??u s? d?ng Macrobid  vo m?i bu?i sng v m?i bu?i t?i trong 5 ngy v b?n hi?n ?ang c cc tri?u ch?ng  B?n c th? s? d?ng Pyridium  c? sau 8 gi? n?u c?n ?? gi?m c?m gic nng rt, ?i?u ny s? khi?n n??c ti?u c?a b?n c mu cam  T?ng l??ng ch?t l?ng c?a b?n thng qua vi?c s? d?ng n??c  Lun lun th?c hnh v? sinh t?t, lau t? tr??c ra sau v trnh cc s?n ph?m m ??o c mi th?m ?? trnh b? kch ?ng thm  N?u cc tri?u ch?ng ti?p t?c t?n t?i sau khi s? d?ng thu?c ho?c ti pht, vui lng lin h? v?i c? s? ch?m [place] kh?n c?p ho?c bc s? chnh c?a b?n n?u c?n.    Your urinalysis currently show infection, your urine will be sent to the lab to determine exactly which bacteria is present, if any changes need to be made to your medications you will be notified  Begin use of Macrobid  every morning and every evening for 5 days as you are currently having symptoms  You may use Pyridium  every  8 hours  as needed to help with burning sensation, this will turn your urine orange  Increase your fluid intake through use of water  As always practice good hygiene, wiping front to back and avoidance of scented vaginal products to prevent further irritation  If symptoms continue to persist after use of medication or recur please follow-up with urgent care or your primary doctor as needed    ED Prescriptions     Medication Sig Dispense Auth. Provider   nitrofurantoin , macrocrystal-monohydrate, (MACROBID ) 100 MG capsule Take 1 capsule (100 mg total) by mouth 2 (two) times daily. 10 capsule Clara Herbison R, NP   phenazopyridine  (PYRIDIUM ) 200 MG tablet Take 1 tablet (200 mg total) by mouth 3 (three) times daily. 6 tablet Verdie Wilms R, NP      PDMP not reviewed this encounter.   Teresa Shelba SAUNDERS, NP 02/02/23 1029

## 2023-02-02 NOTE — ED Triage Notes (Signed)
 Triage completed using [place] interpreter, [place] [phone]  Patient to Urgent Care with complaints of painful urination, urinary frequency, back pain, and intermittent abdominal pain that started one week ago.  Denies any vaginal symptoms.

## 2023-02-04 LAB — URINE CULTURE: Culture: 20000 — AB

## 2023-04-11 NOTE — Progress Notes (Unsigned)
 PCP:  Patient, No Pcp Per   No chief complaint on file.    HPI:      Ms. Karlea Mckibbin is a 38 y.o. [id number] whose LMP was No LMP recorded. (Menstrual status: IUD)., presents today for her annual examination.  Her menses are {norm/abn:715}, lasting {number: 22536} days.  Dysmenorrhea {dysmen:716}. She {does:18564} have intermenstrual bleeding.  Sex activity: {sex active: 315163}. Mirena placed 10/18/20 Last Pap: 07/19/19 Results were: no abnormalities /neg HPV DNA  Hx of STDs:   There is no FH of breast cancer. There is no FH of ovarian cancer. The patient {does:18564} do self-breast exams.  Tobacco use: {tob:20664} Alcohol use: {Alcohol:11675} No drug use.  Exercise: {exercise:31265}  She {does:18564} get adequate calcium and Vitamin D in her diet.  Patient Active Problem List   Diagnosis Date Noted   Prolonged spontaneous rupture of membranes 08/31/2020   Supervision of other normal pregnancy, antepartum 02/28/2020   COVID-19 affecting pregnancy in first trimester 02/02/2020    Past Surgical History:  Procedure Laterality Date   APPENDECTOMY      Family History  Problem Relation Age of Onset   Hypertension Maternal Grandmother    Heart Problems Maternal Grandmother    Hypertension Mother     Social History   Socioeconomic History   Marital status: Married    Spouse name: Not on file   Number of children: Not on file   Years of education: Not on file   Highest education level: Not on file  Occupational History   Not on file  Tobacco Use   Smoking status: Never   Smokeless tobacco: Never  Substance and Sexual Activity   Alcohol use: No   Drug use: No   Sexual activity: Yes    Birth control/protection: I.U.D.  Other Topics Concern   Not on file  Social History Narrative   Not on file   Social Drivers of Health   Financial Resource Strain: Low Risk  (05/26/2022)   Received from [facility]   Overall Financial Resource Strain (CARDIA)     Difficulty of Paying Living Expenses: Not very hard  Food Insecurity: No Food Insecurity (05/26/2022)   Received from [facility]   Hunger Vital Sign    Worried About Running Out of Food in the Last Year: Never true    Ran Out of Food in the Last Year: Never true  Transportation Needs: No Transportation Needs (05/26/2022)   Received from [facility] - Transportation    In the past 12 months, has lack of transportation kept you from medical appointments or from getting medications?: No    Lack of Transportation (Non-Medical): No  Physical Activity: Not on file  Stress: Not on file  Social Connections: Not on file  Intimate Partner Violence: Not on file     Current Outpatient Medications:    aspirin-acetaminophen-caffeine (EXCEDRIN MIGRAINE) 250-250-65 MG tablet, Take 1 tablet by mouth every 8 (eight) hours as needed for headache., Disp: 30 tablet, Rfl: 0   levonorgestrel (MIRENA) 20 MCG/DAY IUD, 1 each by Intrauterine route once., Disp: , Rfl:    nitrofurantoin, macrocrystal-monohydrate, (MACROBID) 100 MG capsule, Take 1 capsule (100 mg total) by mouth 2 (two) times daily., Disp: 10 capsule, Rfl: 0   phenazopyridine (PYRIDIUM) 200 MG tablet, Take 1 tablet (200 mg total) by mouth 3 (three) times daily., Disp: 6 tablet, Rfl: 0     ROS:  Review of Systems BREAST: No  symptoms   Objective: There were no vitals taken for this visit.   OBGyn Exam  Results: No results found for this or any previous visit (from the past 24 hours).  Assessment/Plan: No diagnosis found.  No orders of the defined types were placed in this encounter.            GYN counsel {counseling: 16159}     F/U  No follow-ups on file.  Brynlynn Walko B. Batu Cassin, PA-C 04/11/2023 1:47 PM

## 2023-04-14 ENCOUNTER — Encounter: Payer: Self-pay | Admitting: Obstetrics and Gynecology

## 2023-04-14 ENCOUNTER — Ambulatory Visit (INDEPENDENT_AMBULATORY_CARE_PROVIDER_SITE_OTHER): Payer: Medicaid Other | Admitting: Obstetrics and Gynecology

## 2023-04-14 ENCOUNTER — Other Ambulatory Visit (HOSPITAL_COMMUNITY)
Admission: RE | Admit: 2023-04-14 | Discharge: 2023-04-14 | Disposition: A | Discharge status: Home / Self Care | Source: Ambulatory Visit | Attending: Obstetrics and Gynecology | Admitting: Obstetrics and Gynecology

## 2023-04-14 VITALS — BP 96/64 | Ht 65.0 in | Wt 123.0 lb

## 2023-04-14 DIAGNOSIS — Z30431 Encounter for routine checking of intrauterine contraceptive device: Secondary | ICD-10-CM

## 2023-04-14 DIAGNOSIS — Z1151 Encounter for screening for human papillomavirus (HPV): Secondary | ICD-10-CM | POA: Insufficient documentation

## 2023-04-14 DIAGNOSIS — Z124 Encounter for screening for malignant neoplasm of cervix: Secondary | ICD-10-CM

## 2023-04-14 DIAGNOSIS — Z01419 Encounter for gynecological examination (general) (routine) without abnormal findings: Secondary | ICD-10-CM | POA: Diagnosis not present

## 2023-04-14 DIAGNOSIS — R102 Pelvic and perineal pain: Secondary | ICD-10-CM

## 2023-04-14 DIAGNOSIS — Z3202 Encounter for pregnancy test, result negative: Secondary | ICD-10-CM

## 2023-04-14 DIAGNOSIS — N921 Excessive and frequent menstruation with irregular cycle: Secondary | ICD-10-CM

## 2023-04-14 LAB — POCT URINE PREGNANCY: Preg Test, Ur: NEGATIVE

## 2023-04-14 NOTE — Patient Instructions (Signed)
 I value your feedback and you entrusting [place] with your care. If you get a [facility] patient survey, I would appreciate you taking the time to let us know about your experience today. Thank you! ? ? ?

## 2023-04-15 LAB — CYTOLOGY - PAP
Comment: NEGATIVE
Diagnosis: NEGATIVE
High risk HPV: NEGATIVE

## 2023-05-14 ENCOUNTER — Telehealth: Payer: Self-pay | Admitting: Obstetrics and Gynecology

## 2023-05-14 ENCOUNTER — Ambulatory Visit

## 2023-05-14 DIAGNOSIS — Z30431 Encounter for routine checking of intrauterine contraceptive device: Secondary | ICD-10-CM | POA: Diagnosis not present

## 2023-05-14 DIAGNOSIS — R102 Pelvic and perineal pain: Secondary | ICD-10-CM | POA: Diagnosis not present

## 2023-05-14 DIAGNOSIS — N921 Excessive and frequent menstruation with irregular cycle: Secondary | ICD-10-CM

## 2023-05-14 NOTE — Telephone Encounter (Signed)
 Spoke with pt via Language Line [place] re: GYN u/s results. IUD not in correct location. Pt wants replacement. Will RTO for Mirena  IUD, NSAIDs 1 hr before. Will do UPT as well.

## 2023-05-19 ENCOUNTER — Encounter: Payer: Self-pay | Admitting: Obstetrics and Gynecology

## 2023-05-19 ENCOUNTER — Ambulatory Visit (INDEPENDENT_AMBULATORY_CARE_PROVIDER_SITE_OTHER): Admitting: Obstetrics and Gynecology

## 2023-05-19 VITALS — BP 110/64 | Ht 65.0 in | Wt 123.0 lb

## 2023-05-19 DIAGNOSIS — Z30433 Encounter for removal and reinsertion of intrauterine contraceptive device: Secondary | ICD-10-CM | POA: Diagnosis not present

## 2023-05-19 DIAGNOSIS — Z3202 Encounter for pregnancy test, result negative: Secondary | ICD-10-CM

## 2023-05-19 DIAGNOSIS — T8332XD Displacement of intrauterine contraceptive device, subsequent encounter: Secondary | ICD-10-CM

## 2023-05-19 DIAGNOSIS — Z30016 Encounter for initial prescription of transdermal patch hormonal contraceptive device: Secondary | ICD-10-CM

## 2023-05-19 LAB — POCT URINE PREGNANCY: Preg Test, Ur: NEGATIVE

## 2023-05-19 MED ORDER — NORELGESTROMIN-ETH ESTRADIOL 150-35 MCG/24HR TD PTWK: 1.0000 | MEDICATED_PATCH | TRANSDERMAL | 3 refills | Status: AC

## 2023-05-19 NOTE — Progress Notes (Signed)
 Patient, No Pcp Per   Chief Complaint  Patient presents with   Contraception    Mirena  replacement    HPI:      Ms. Stacie Garrison is a 38 y.o. [id number] whose LMP was No LMP recorded. (Menstrual status: IUD)., presents today for Mirena  IUD replacement due to malpositon, confirmed on GYN u/s. Her menses had been absent with IUD for 2 yrs; had occas random 1 day spotting with IUD in past. Had light bleeding for the last 2 wks, no sx for 2 days. No dysmen. Elita Guitar gets suprapubic pains with heavy lifting/hard work. Gyn u/s showed IUD in cx canal. Pt also has small uterues, no leio.   No hx of HTN, DVTs, migraines with aura.   NEEDS [place]ESE INTERPRETER   Patient Active Problem List   Diagnosis Date Noted   Prolonged spontaneous rupture of membranes 08/31/2020   Supervision of other normal pregnancy, antepartum 02/28/2020   COVID-19 affecting pregnancy in first trimester 02/02/2020    Past Surgical History:  Procedure Laterality Date   APPENDECTOMY      Family History  Problem Relation Age of Onset   Hypertension Mother    Hypertension Maternal Grandmother    Heart Problems Maternal Grandmother    Lung cancer Cousin     Social History   Socioeconomic History   Marital status: Married    Spouse name: Not on file   Number of children: Not on file   Years of education: Not on file   Highest education level: Not on file  Occupational History   Not on file  Tobacco Use   Smoking status: Never   Smokeless tobacco: Never  Vaping Use   Vaping status: Never Used  Substance and Sexual Activity   Alcohol use: No   Drug use: No   Sexual activity: Yes    Birth control/protection: I.U.D.    Comment: Mirena   Other Topics Concern   Not on file  Social History Narrative   Not on file   Social Drivers of Health   Financial Resource Strain: Low Risk  (05/26/2022)   Received from [facility]   Overall Financial Resource Strain (CARDIA)    Difficulty of  Paying Living Expenses: Not very hard  Food Insecurity: No Food Insecurity (05/26/2022)   Received from [facility]   Hunger Vital Sign    Worried About Running Out of Food in the Last Year: Never true    Ran Out of Food in the Last Year: Never true  Transportation Needs: No Transportation Needs (05/26/2022)   Received from [facility] - Transportation    In the past 12 months, has lack of transportation kept you from medical appointments or from getting medications?: No    Lack of Transportation (Non-Medical): No  Physical Activity: Not on file  Stress: Not on file  Social Connections: Not on file  Intimate Partner Violence: Not on file    Outpatient Medications Prior to Visit  Medication Sig Dispense Refill   levonorgestrel  (MIRENA ) 20 MCG/DAY IUD 1 each by Intrauterine route once.     No facility-administered medications prior to visit.      ROS:  Review of Systems  Constitutional:  Negative for fever.  Gastrointestinal:  Negative for blood in stool, constipation, diarrhea, nausea and vomiting.  Genitourinary:  Positive for vaginal bleeding. Negative for dyspareunia, dysuria, flank pain, frequency, hematuria, urgency, vaginal discharge and vaginal pain.  Musculoskeletal:  Negative  for back pain.  Skin:  Negative for rash.   BREAST: No symptoms   OBJECTIVE:   Vitals:  BP 110/64   Ht 5\' 5"  (1.651 m)   Wt 123 lb (55.8 kg)   BMI 20.47 kg/m   Physical Exam Vitals reviewed.  Constitutional:      Appearance: She is well-developed.  Pulmonary:     Effort: Pulmonary effort is normal.  Genitourinary:    General: Normal vulva.     Pubic Area: No rash.      Labia:        Right: No rash, tenderness or lesion.        Left: No rash, tenderness or lesion.      Vagina: Normal. No vaginal discharge, erythema or tenderness.     Cervix: Normal.     Uterus: Normal. Not enlarged and not tender.      Adnexa: Right adnexa normal and  left adnexa normal.       Right: No mass or tenderness.         Left: No mass or tenderness.       Comments: IUD STRINGS LONG IN CX OS Musculoskeletal:        General: Normal range of motion.     Cervical back: Normal range of motion.  Skin:    General: Skin is warm and dry.  Neurological:     General: No focal deficit present.     Mental Status: She is alert and oriented to person, place, and time.  Psychiatric:        Mood and Affect: Mood normal.        Behavior: Behavior normal.        Thought Content: Thought content normal.        Judgment: Judgment normal.     IUD Removal Strings of IUD identified and grasped.  IUD removed without problem with ring forceps.  Pt tolerated this well.  IUD noted to be intact.  IUD Insertion Procedure Note Patient identified, informed consent performed, consent signed.   Discussed risks of irregular bleeding, cramping, infection, malpositioning or misplacement of the IUD outside the uterus which may require further procedure such as laparoscopy, risk of failure <1%. Time out was performed.  Urine pregnancy test negative.  Speculum placed in the vagina.  Cervix visualized.  Cleaned with Betadine x 2.  Grasped anteriorly with a single tooth tenaculum.  Uterus sounded to 6.0 cm (confirmed small uterus on Gyn u/s), dilators used to make sure only 6 cm.   IUD placed per manufacturer's recommendations.  Strings trimmed to 3 cm. Tenaculum was removed, good hemostasis noted.  Patient tolerated procedure well.   During clean up, noticed tip of IUD at ext cx os and new IUD removed.   Results: Results for orders placed or performed in visit on 05/19/23 (from the past 24 hours)  POCT urine pregnancy     Status: Normal   Collection Time: 05/19/23  4:59 PM  Result Value Ref Range   Preg Test, Ur Negative Negative     Assessment/Plan: Encounter for initial prescription of transdermal patch hormonal contraceptive device - Plan: norelgestromin-ethinyl  estradiol  (XULANE) 150-35 MCG/24HR transdermal patch; pt not good candidate for IUD given malposition and then started to come out after insertion. BC options discussed, pt would like xulane. Rx eRxd. Start today, condoms for 7 days. F/u prn.   Encounter for removal and reinsertion of IUD - Plan: POCT urine pregnancy; IUD inserted and then started to expel shortly  after insertion.    Meds ordered this encounter  Medications   norelgestromin-ethinyl estradiol  (XULANE) 150-35 MCG/24HR transdermal patch    Sig: Place 1 patch onto the skin once a week. Apply 1 patch weekly for 3 weeks, then 1 week without patch    Dispense:  9 patch    Refill:  3    Supervising Provider:   ROBY, MICIA [[id number]]      Return if symptoms worsen or fail to improve.  Leandrea Ackley B. Annjeanette Sarwar, PA-C 05/19/2023 5:04 PM

## 2023-05-19 NOTE — Patient Instructions (Addendum)
 I value your feedback and you entrusting [place] with your care. If you get a [facility] patient survey, I would appreciate you taking the time to let us know about your experience today. Thank you! ? ? ?

## 2023-07-27 ENCOUNTER — Ambulatory Visit: Admitting: Family Medicine

## 2023-07-28 ENCOUNTER — Other Ambulatory Visit: Payer: Self-pay | Admitting: Physician Assistant

## 2023-07-28 ENCOUNTER — Ambulatory Visit (INDEPENDENT_AMBULATORY_CARE_PROVIDER_SITE_OTHER): Admitting: Physician Assistant

## 2023-07-28 ENCOUNTER — Encounter: Payer: Self-pay | Admitting: Physician Assistant

## 2023-07-28 ENCOUNTER — Other Ambulatory Visit (HOSPITAL_COMMUNITY)
Admission: RE | Admit: 2023-07-28 | Discharge: 2023-07-28 | Disposition: A | Discharge status: Home / Self Care | Source: Ambulatory Visit | Attending: Physician Assistant | Admitting: Physician Assistant

## 2023-07-28 VITALS — BP 92/64 | HR 85 | Resp 16 | Ht 65.0 in | Wt 113.2 lb

## 2023-07-28 DIAGNOSIS — R5383 Other fatigue: Secondary | ICD-10-CM

## 2023-07-28 DIAGNOSIS — N898 Other specified noninflammatory disorders of vagina: Secondary | ICD-10-CM

## 2023-07-28 DIAGNOSIS — R399 Unspecified symptoms and signs involving the genitourinary system: Secondary | ICD-10-CM | POA: Diagnosis not present

## 2023-07-28 DIAGNOSIS — R590 Localized enlarged lymph nodes: Secondary | ICD-10-CM | POA: Diagnosis not present

## 2023-07-28 DIAGNOSIS — R634 Abnormal weight loss: Secondary | ICD-10-CM | POA: Diagnosis not present

## 2023-07-28 DIAGNOSIS — Z7689 Persons encountering health services in other specified circumstances: Secondary | ICD-10-CM

## 2023-07-28 LAB — POCT URINALYSIS DIPSTICK
Glucose, UA: NEGATIVE
Leukocytes, UA: NEGATIVE
Nitrite, UA: NEGATIVE
Protein, UA: POSITIVE — AB
Spec Grav, UA: 1.015 (ref 1.010–1.025)
Urobilinogen, UA: 0.2 U/dL
pH, UA: 5 (ref 5.0–8.0)

## 2023-07-28 NOTE — Progress Notes (Unsigned)
 New patient visit  Patient: Stacie Garrison   DOB: 10/19/1985   38 y.o. Female  MRN: [id number] Visit Date: 07/28/2023  Today's healthcare provider: Jolynn Spencer, PA-C   Chief Complaint  Patient presents with   New Patient (Initial Visit)    Np/ Liver/ Numbness in both hands/feet off and on. Hep B runs in family. Possible UTI? Blood work for Cancer ( colon and lung)    Subjective    Stacie Garrison is a 38 y.o. female who presents today as a new patient to establish care.   Discussed the use of AI scribe software for clinical note transcription with the patient, who gave verbal consent to proceed.  History of Present Illness Stacie Garrison is a 38 year old female who presents to establish care and discuss multiple health concerns.  She has experienced low blood pressure and significant weight loss of about 20 pounds over the past three months due to recent dental work. This has resulted in inadequate nutrition, leading to fatigue and occasional shortness of breath, especially during physical activity.  She has a history of urinary tract infections, with the most recent episode occurring approximately one week ago. She was treated with antibiotics earlier this year and has noticed changes in urination recently.  She does not use medications regularly and denies smoking, alcohol, or recreational drug use. She consumes coffee occasionally. She has not received the HPV vaccine and is unsure about her hepatitis B vaccination status, having received multiple vaccinations in [place] without knowing the specifics.    Past Medical History:  Diagnosis Date   COVID-19 02/02/2020   Symptomatic since about 01/29/2020, tested + in [facility] ED on 02/02/2020   Medical history non-contributory    Past Surgical History:  Procedure Laterality Date   APPENDECTOMY     Family Status  Relation Name Status   Mother  Alive   MGM  Deceased   Arzella Pries 1st Deceased  No partnership data on file   Family History   Problem Relation Age of Onset   Hypertension Mother    Hypertension Maternal Grandmother    Heart Problems Maternal Grandmother    Lung cancer Cousin    Social History   Socioeconomic History   Marital status: Married    Spouse name: Not on file   Number of children: Not on file   Years of education: Not on file   Highest education level: Not on file  Occupational History   Not on file  Tobacco Use   Smoking status: Never   Smokeless tobacco: Never  Vaping Use   Vaping status: Never Used  Substance and Sexual Activity   Alcohol use: No   Drug use: No   Sexual activity: Yes    Birth control/protection: I.U.D.    Comment: Mirena   Other Topics Concern   Not on file  Social History Narrative   Not on file   Social Drivers of Health   Financial Resource Strain: Low Risk  (05/26/2022)   Received from [facility]   Overall Financial Resource Strain (CARDIA)    Difficulty of Paying Living Expenses: Not very hard  Food Insecurity: No Food Insecurity (05/26/2022)   Received from [facility]   Hunger Vital Sign    Within the past 12 months, you worried that your food would run out before you got the money to buy more.: Never true    Within the past 12 months, the food you bought just didn't  last and you didn't have money to get more.: Never true  Transportation Needs: No Transportation Needs (05/26/2022)   Received from [facility] - Transportation    In the past 12 months, has lack of transportation kept you from medical appointments or from getting medications?: No    Lack of Transportation (Non-Medical): No  Physical Activity: Not on file  Stress: Not on file  Social Connections: Not on file   Outpatient Medications Prior to Visit  Medication Sig   norelgestromin -ethinyl estradiol  (XULANE) 150-35 MCG/24HR transdermal patch Place 1 patch onto the skin once a week. Apply 1 patch weekly for 3 weeks, then 1 week  without patch   No facility-administered medications prior to visit.   No Known Allergies  Immunization History  Administered Date(s) Administered   PFIZER(Purple Top)SARS-COV-2 Vaccination 04/18/2019, 05/10/2019   Tdap 07/18/2020    Health Maintenance  Topic Date Due   Hepatitis C Screening  Never done   HPV VACCINES (1 - 3-dose SCDM series) Never done   COVID-19 Vaccine (3 - 2024-25 season) 08/13/2023 (Originally 09/21/2022)   INFLUENZA VACCINE  08/21/2023   Cervical Cancer Screening (HPV/Pap Cotest)  04/13/2028   DTaP/Tdap/Td (2 - Td or Tdap) 07/19/2030   Hepatitis B Vaccines  Completed   HIV Screening  Completed   Meningococcal B Vaccine  Aged Out    Patient Care Team: Patient, No Pcp Per as PCP - General (General Practice)  Review of Systems  All other systems reviewed and are negative.  Except see HPI   {Insert previous labs (optional):23779} {See past labs  Heme  Chem  Endocrine  Serology  Results Review (optional):1}   Objective    BP 92/64 (BP Location: Left Arm, Patient Position: Sitting, Cuff Size: Normal)   Pulse 85   Resp 16   Ht 5' 5 (1.651 m)   Wt 113 lb 3.2 oz (51.3 kg)   SpO2 100%   BMI 18.84 kg/m  {Insert last BP/Wt (optional):23777}{See vitals history (optional):1}   Physical Exam Vitals reviewed.  Constitutional:      General: She is not in acute distress.    Appearance: Normal appearance. She is well-developed. She is not diaphoretic.  HENT:     Head: Normocephalic and atraumatic.  Eyes:     General: No scleral icterus.    Conjunctiva/sclera: Conjunctivae normal.  Neck:     Thyroid: No thyromegaly.  Cardiovascular:     Rate and Rhythm: Normal rate and regular rhythm.     Pulses: Normal pulses.     Heart sounds: Normal heart sounds. No murmur heard. Pulmonary:     Effort: Pulmonary effort is normal. No respiratory distress.     Breath sounds: Normal breath sounds. No wheezing, rhonchi or rales.  Musculoskeletal:     Cervical  back: Neck supple.     Right lower leg: No edema.     Left lower leg: No edema.  Lymphadenopathy:     Cervical: No cervical adenopathy.  Skin:    General: Skin is warm and dry.     Findings: No rash.  Neurological:     Mental Status: She is alert and oriented to person, place, and time. Mental status is at baseline.  Psychiatric:        Mood and Affect: Mood normal.        Behavior: Behavior normal.     Depression Screen     No data to display  No results found for any visits on 07/28/23.  Assessment & Plan      Assessment & Plan Weight Loss and Hypotension Weight loss of 20 pounds wnt 3 month period due to dental issues led to hypotension from poor nutrition.  Urinary Tract Infection (UTI) Possible UTI recurrence indicated by urinary changes. Married, has 2 children  Vaginal Discharge Vaginal discharge with differential diagnosis including STIs. Informed consent obtained for STI testing. Explained routine hepatitis C screening. - Perform tests for chlamydia and gonorrhea. - Perform screening for hepatitis C and HIV. - Discuss results and treatment options based on findings. Will follow-up  Lymphadenopathy Tender lymph nodes with unclear cause. Ultrasound needed for further investigation. Advised to verify insurance coverage. - Order ultrasound of lymph nodes. - Instruct her to check with insurance for coverage of ultrasound.  General Health Maintenance Considering HPV vaccination. Discussed benefits and potential redundancy in monogamous relationship. Due for routine screenings. - Discuss HPV vaccination at next visit. - Review childhood vaccination records if available.  Follow-up Requires follow-up for further evaluation and management. Blood work requires fasting. - Schedule follow-up appointment in one month. - Send lab results to her as soon as available. - Instruct her to fast for 8 hours before blood work.  Encounter to establish  care Welcomed to our clinic Reviewed past medical hx, social hx, family hx and surgical hx Pt advised to send all vaccination records or screening  Weight loss  - CBC with Differential/Platelet - Comprehensive metabolic panel with GFR - Hemoglobin A1c - Lipid panel - TSH - VITAMIN D  25 Hydroxy (Vit-D Deficiency, Fractures) - Vitamin B12  Other fatigue (Primary) Unclear reason  Has been having this problem for some time Pt has concerns about family hx positive for hep B Per [place] immunization record review, pt had all vaccinations Pt does not remember when she had a pap smear Denies tobacco smoking history or drinking alcohol/recreational drugs Her first cousin was diagnosed with colon cancer when he was 37 yo, he died within a month Pt has been worried about liver infection but denies nausea, vomiting, abdominal pain, changes in BM Initial Workup ordered: - CBC with Differential/Platelet - Comprehensive metabolic panel with GFR - Hemoglobin A1c - Lipid panel - TSH - VITAMIN D  25 Hydroxy (Vit-D Deficiency, Fractures) - Vitamin B12 Will follow-up  UTI symptoms  - POCT urinalysis dipstick - Urinalysis, microscopic only - Urine Culture  Vaginal discharge  - Cervicovaginal ancillary only - HIV antibody (with reflex)   Return in about 4 weeks (around 08/25/2023).    The patient was advised to call back or seek an in-person evaluation if the symptoms worsen or if the condition fails to improve as anticipated.  I discussed the assessment and treatment plan with the patient. The patient was provided an opportunity to ask questions and all were answered. The patient agreed with the plan and demonstrated an understanding of the instructions.  I, Suhana Wilner, PA-C have reviewed all documentation for this visit. The documentation on  07/28/2023   for the exam, diagnosis, procedures, and orders are all accurate and complete.  Jolynn Spencer, [facility], MMS [facility] [phone] (phone) [phone] (fax)  [facility]

## 2023-07-29 ENCOUNTER — Telehealth: Payer: Self-pay

## 2023-07-29 LAB — CERVICOVAGINAL ANCILLARY ONLY
Bacterial Vaginitis (gardnerella): NEGATIVE
Candida Glabrata: NEGATIVE
Candida Vaginitis: NEGATIVE
Chlamydia: NEGATIVE
Comment: NEGATIVE
Comment: NEGATIVE
Comment: NEGATIVE
Comment: NEGATIVE
Comment: NEGATIVE
Comment: NORMAL
Neisseria Gonorrhea: NEGATIVE
Trichomonas: NEGATIVE

## 2023-07-29 NOTE — Telephone Encounter (Signed)
 SABRA

## 2023-07-30 ENCOUNTER — Ambulatory Visit: Payer: Self-pay | Admitting: Physician Assistant

## 2023-07-30 DIAGNOSIS — R399 Unspecified symptoms and signs involving the genitourinary system: Secondary | ICD-10-CM

## 2023-07-30 LAB — COMPREHENSIVE METABOLIC PANEL WITH GFR
ALT: 31 IU/L (ref 0–32)
AST: 28 IU/L (ref 0–40)
Albumin: 4.3 g/dL (ref 3.9–4.9)
Alkaline Phosphatase: 51 IU/L (ref 44–121)
BUN/Creatinine Ratio: 16 (ref 9–23)
BUN: 13 mg/dL (ref 6–20)
Bilirubin Total: 0.6 mg/dL (ref 0.0–1.2)
CO2: 20 mmol/L (ref 20–29)
Calcium: 9.4 mg/dL (ref 8.7–10.2)
Chloride: 106 mmol/L (ref 96–106)
Creatinine, Ser: 0.8 mg/dL (ref 0.57–1.00)
Globulin, Total: 3.5 g/dL (ref 1.5–4.5)
Glucose: 98 mg/dL (ref 70–99)
Potassium: 4.8 mmol/L (ref 3.5–5.2)
Sodium: 141 mmol/L (ref 134–144)
Total Protein: 7.8 g/dL (ref 6.0–8.5)
eGFR: 97 mL/min/1.73 (ref >59)

## 2023-07-30 LAB — LIPID PANEL
Chol/HDL Ratio: 2.8 ratio (ref 0.0–4.4)
Cholesterol, Total: 158 mg/dL (ref 100–199)
HDL: 57 mg/dL (ref >39)
LDL Chol Calc (NIH): 87 mg/dL (ref 0–99)
Triglycerides: 71 mg/dL (ref 0–149)
VLDL Cholesterol Cal: 14 mg/dL (ref 5–40)

## 2023-07-30 LAB — CBC WITH DIFFERENTIAL/PLATELET
Basophils Absolute: 0 x10E3/uL (ref 0.0–0.2)
Basos: 1 %
EOS (ABSOLUTE): 0.3 x10E3/uL (ref 0.0–0.4)
Eos: 6 %
Hematocrit: 38.1 % (ref 34.0–46.6)
Hemoglobin: 12.1 g/dL (ref 11.1–15.9)
Immature Grans (Abs): 0 x10E3/uL (ref 0.0–0.1)
Immature Granulocytes: 0 %
Lymphocytes Absolute: 2.3 x10E3/uL (ref 0.7–3.1)
Lymphs: 41 %
MCH: 28.4 pg (ref 26.6–33.0)
MCHC: 31.8 g/dL (ref 31.5–35.7)
MCV: 89 fL (ref 79–97)
Monocytes Absolute: 0.3 x10E3/uL (ref 0.1–0.9)
Monocytes: 5 %
Neutrophils Absolute: 2.6 x10E3/uL (ref 1.4–7.0)
Neutrophils: 47 %
RBC: 4.26 x10E6/uL (ref 3.77–5.28)
RDW: 12.3 % (ref 11.7–15.4)
WBC: 5.5 x10E3/uL (ref 3.4–10.8)

## 2023-07-30 LAB — VITAMIN B12: Vitamin B-12: 560 pg/mL (ref 232–1245)

## 2023-07-30 LAB — URINALYSIS, MICROSCOPIC ONLY
Casts: NONE SEEN /LPF
RBC, Urine: NONE SEEN /HPF (ref 0–2)

## 2023-07-30 LAB — TSH: TSH: 1.09 u[IU]/mL (ref 0.450–4.500)

## 2023-07-30 LAB — HEMOGLOBIN A1C
Est. average glucose Bld gHb Est-mCnc: 97 mg/dL
Hgb A1c MFr Bld: 5 % (ref 4.8–5.6)

## 2023-07-30 LAB — VITAMIN D 25 HYDROXY (VIT D DEFICIENCY, FRACTURES): Vit D, 25-Hydroxy: 42 ng/mL (ref 30.0–100.0)

## 2023-07-30 LAB — URINE CULTURE

## 2023-07-30 LAB — SPECIMEN STATUS REPORT

## 2023-07-31 MED ORDER — NITROFURANTOIN MONOHYD MACRO 100 MG PO CAPS: 100.0000 mg | ORAL_CAPSULE | Freq: Two times a day (BID) | ORAL | 0 refills | Status: AC

## 2023-08-05 LAB — URINE CULTURE

## 2023-08-06 NOTE — Addendum Note (Signed)
 Addended by: Caral Whan on: 08/06/2023 01:18 PM   Modules accepted: Orders

## 2023-08-28 ENCOUNTER — Other Ambulatory Visit (HOSPITAL_COMMUNITY)
Admission: RE | Admit: 2023-08-28 | Discharge: 2023-08-28 | Disposition: A | Discharge status: Home / Self Care | Source: Ambulatory Visit | Attending: Physician Assistant | Admitting: Physician Assistant

## 2023-08-28 ENCOUNTER — Encounter: Payer: Self-pay | Admitting: Physician Assistant

## 2023-08-28 ENCOUNTER — Ambulatory Visit: Admitting: Physician Assistant

## 2023-08-28 VITALS — BP 96/68 | HR 77 | Resp 16 | Ht 65.0 in | Wt 111.9 lb

## 2023-08-28 DIAGNOSIS — Z23 Encounter for immunization: Secondary | ICD-10-CM | POA: Diagnosis not present

## 2023-08-28 DIAGNOSIS — Z758 Other problems related to medical facilities and other health care: Secondary | ICD-10-CM

## 2023-08-28 DIAGNOSIS — L819 Disorder of pigmentation, unspecified: Secondary | ICD-10-CM

## 2023-08-28 DIAGNOSIS — R599 Enlarged lymph nodes, unspecified: Secondary | ICD-10-CM

## 2023-08-28 DIAGNOSIS — N898 Other specified noninflammatory disorders of vagina: Secondary | ICD-10-CM

## 2023-08-28 DIAGNOSIS — M25519 Pain in unspecified shoulder: Secondary | ICD-10-CM

## 2023-08-28 DIAGNOSIS — R399 Unspecified symptoms and signs involving the genitourinary system: Secondary | ICD-10-CM | POA: Insufficient documentation

## 2023-08-28 DIAGNOSIS — R09A2 Foreign body sensation, throat: Secondary | ICD-10-CM

## 2023-08-28 DIAGNOSIS — R5383 Other fatigue: Secondary | ICD-10-CM

## 2023-08-28 DIAGNOSIS — Z603 Acculturation difficulty: Secondary | ICD-10-CM

## 2023-08-28 DIAGNOSIS — Z2989 Encounter for other specified prophylactic measures: Secondary | ICD-10-CM | POA: Insufficient documentation

## 2023-08-28 DIAGNOSIS — M542 Cervicalgia: Secondary | ICD-10-CM

## 2023-08-28 LAB — POCT URINALYSIS DIPSTICK
Bilirubin, UA: NEGATIVE
Glucose, UA: NEGATIVE
Ketones, UA: NEGATIVE
Leukocytes, UA: NEGATIVE
Nitrite, UA: NEGATIVE
Protein, UA: POSITIVE — AB
Spec Grav, UA: 1.015 (ref 1.010–1.025)
Urobilinogen, UA: 0.2 U/dL
pH, UA: 6 (ref 5.0–8.0)

## 2023-08-28 NOTE — Progress Notes (Signed)
 Will Established patient visit  Patient: Stacie Garrison   DOB: August 01, 1985   38 y.o. Female  MRN: [id number] Visit Date: 08/28/2023  Today's healthcare provider: Jolynn Spencer, PA-C   Chief Complaint  Patient presents with   Follow-up    1 month Discuss Hepatitis    Subjective     HPI     Follow-up    Additional comments: 1 month Discuss Hepatitis       Last edited by Wilfred Hargis RAMAN, CMA on 08/28/2023  2:05 PM.       Discussed the use of AI scribe software for clinical note transcription with the patient, who gave verbal consent to proceed.  History of Present Illness Stacie Garrison is a 38 year old female who presents with concerns of recurrent urinary tract infections and vaginal discharge. She is accompanied by her son.  She has recurrent urinary tract infections and recently completed a course of antibiotics for a UTI, with resolution of symptoms. She also finished another antibiotic course after dental work. She experiences white vaginal discharge without itching or abnormal odor and has only one sexual partner.  She feels tired and sometimes has soreness and pressure in her shoulder and neck. She notices brown spots on her body but has no blood in her stool or urine. She experiences a sensation of something stuck in her throat when swallowing saliva but denies cold or heat intolerance. Her blood glucose levels were elevated three years ago but were normal one month ago.  [place] [id number]     08/28/2023    2:13 PM 07/28/2023    1:19 PM  Depression screen PHQ 2/9  Decreased Interest 1 1  Down, Depressed, Hopeless 1 0  PHQ - 2 Score 2 1  Altered sleeping 0 0  Tired, decreased energy 1 1  Change in appetite 1 0  Feeling bad or failure about yourself  0 0  Trouble concentrating 1 1  Moving slowly or fidgety/restless 0 0  Suicidal thoughts 0 0  PHQ-9 Score 5 3  Difficult doing work/chores Not difficult at all Somewhat difficult      08/28/2023    2:14 PM 07/28/2023    1:20 PM   GAD 7 : Generalized Anxiety Score  Nervous, Anxious, on Edge 1 1  Control/stop worrying 1 2  Worry too much - different things 1 1  Trouble relaxing 1 1  Restless 0 0  Easily annoyed or irritable 0 1  Afraid - awful might happen 0 0  Total GAD 7 Score 4 6  Anxiety Difficulty Somewhat difficult Somewhat difficult    Medications: Outpatient Medications Prior to Visit  Medication Sig   norelgestromin -ethinyl estradiol  (XULANE) 150-35 MCG/24HR transdermal patch Place 1 patch onto the skin once a week. Apply 1 patch weekly for 3 weeks, then 1 week without patch   No facility-administered medications prior to visit.    Review of Systems All negative Except see HPI       Objective    BP 96/68 (BP Location: Left Arm, Patient Position: Sitting, Cuff Size: Normal)   Pulse 77   Resp 16   Ht 5' 5 (1.651 m)   Wt 111 lb 14.4 oz (50.8 kg)   SpO2 98%   BMI 18.62 kg/m     Physical Exam Vitals reviewed.  Constitutional:      General: She is not in acute distress.    Appearance: Normal appearance. She is well-developed. She is not diaphoretic.  HENT:  Head: Normocephalic and atraumatic.  Eyes:     General: No scleral icterus.    Conjunctiva/sclera: Conjunctivae normal.  Neck:     Thyroid: No thyromegaly.  Cardiovascular:     Rate and Rhythm: Normal rate and regular rhythm.     Pulses: Normal pulses.     Heart sounds: Normal heart sounds. No murmur heard. Pulmonary:     Effort: Pulmonary effort is normal. No respiratory distress.     Breath sounds: Normal breath sounds. No wheezing, rhonchi or rales.  Musculoskeletal:     Cervical back: Neck supple.     Right lower leg: No edema.     Left lower leg: No edema.  Lymphadenopathy:     Cervical: No cervical adenopathy.  Skin:    General: Skin is warm and dry.     Findings: No rash.  Neurological:     Mental Status: She is alert and oriented to person, place, and time. Mental status is at baseline.  Psychiatric:         Mood and Affect: Mood normal.        Behavior: Behavior normal.      No results found for any visits on 08/28/23.      Assessment & Plan Vaginal discharge  Could be due to vulvovaginal candidiasis (yeast infection) Intermittent white discharge without odor or itching. Differential diagnosis includes bacterial vaginosis. - Perform self-swab to check for yeast infection and bacterial vaginosis. Will follow-up  UTI  Improved , most symptoms  evaluation for recurrent urinary tract infection Completed antibiotics for UTI and dental work. No current symptoms of UTI. - Perform urine test and send for culture to check for UTI. Will follow-up  Benign pigmented skin lesions (melanocytic nevi) Presence of brown spots on the body. Referral to dermatology for further evaluation and possible biopsy. She prefers to be seen in [place], which may require a longer wait. - Refer to dermatology for evaluation of pigmented skin lesions. Will follow-up  Neck and shoulder pain (cervicalgia) Intermittent soreness and pressure in the shoulder and neck, likely related to positioning. - Recommend daily exercises, including yoga or Pilates, to improve posture and relieve neck and shoulder pain. Advised over-the-counter pain medications, heat/ice/massage Will follow-up  Throat globus sensation Sensation of something stuck in the throat when swallowing saliva. No cold or heat intolerance. - Order ultrasound to evaluate throat globus sensation.  Fatigue Ongoing fatigue despite normal lab results. Previous elevated glucose levels now normal. Vitamin D  levels normal but supplementation recommended. - Recommend over-the-counter vitamin D3 supplementation, 1000 units daily.   UTI symptoms (Primary)  - POCT urinalysis dipstick - Urine Culture - Cervicovaginal ancillary only  Vaginal discharge  - POCT urinalysis dipstick - Urine Culture - Cervicovaginal ancillary only  Change in multiple  pigmented skin lesions Follow-up case thank you - Ambulatory referral to Dermatology  Enlarged lymph nodes  - US  Soft Tissue Head/Neck (NON-THYROID); Future  Globus sensation  - US  Soft Tissue Head/Neck (NON-THYROID); Future  Language barrier affecting health care Needs Interpreter  Need for prophylactic immunotherapy  - HPV 9-valent vaccine,Recombinat Will repeat 2 shot at the follow-up   No orders of the defined types were placed in this encounter.   No follow-ups on file.   The patient was advised to call back or seek an in-person evaluation if the symptoms worsen or if the condition fails to improve as anticipated.  I discussed the assessment and treatment plan with the patient. The patient was provided an opportunity to ask  questions and all were answered. The patient agreed with the plan and demonstrated an understanding of the instructions.  I, Markel Kurtenbach, PA-C have reviewed all documentation for this visit. The documentation on 08/28/2023  for the exam, diagnosis, procedures, and orders are all accurate and complete.  I spent 30 minutes dedicated to the care of this patient on the date of this encounter to include pre-visit review of records, face-to-face with the patient discussing condition/treatment , and post visit ordering of testing.  Jolynn Spencer, [facility], MMS [facility] [phone] (phone) [phone] (fax)  [facility]

## 2023-08-30 LAB — URINE CULTURE: Organism ID, Bacteria: NO GROWTH

## 2023-09-01 LAB — CERVICOVAGINAL ANCILLARY ONLY
Bacterial Vaginitis (gardnerella): POSITIVE — AB
Candida Glabrata: NEGATIVE
Candida Vaginitis: NEGATIVE
Comment: NEGATIVE
Comment: NEGATIVE
Comment: NEGATIVE

## 2023-09-02 ENCOUNTER — Ambulatory Visit: Payer: Self-pay | Admitting: Physician Assistant

## 2023-09-02 DIAGNOSIS — N898 Other specified noninflammatory disorders of vagina: Secondary | ICD-10-CM

## 2023-09-02 DIAGNOSIS — B9689 Other specified bacterial agents as the cause of diseases classified elsewhere: Secondary | ICD-10-CM

## 2023-09-02 MED ORDER — METRONIDAZOLE 500 MG PO TABS: 500.0000 mg | ORAL_TABLET | Freq: Two times a day (BID) | ORAL | 0 refills | Status: AC

## 2023-09-07 ENCOUNTER — Ambulatory Visit
Admission: RE | Admit: 2023-09-07 | Discharge: 2023-09-07 | Disposition: A | Discharge status: Home / Self Care | Source: Ambulatory Visit | Attending: Physician Assistant | Admitting: Physician Assistant

## 2023-09-07 DIAGNOSIS — R599 Enlarged lymph nodes, unspecified: Secondary | ICD-10-CM | POA: Diagnosis present

## 2023-09-07 DIAGNOSIS — R09A2 Foreign body sensation, throat: Secondary | ICD-10-CM | POA: Insufficient documentation

## 2023-09-28 ENCOUNTER — Ambulatory Visit (INDEPENDENT_AMBULATORY_CARE_PROVIDER_SITE_OTHER)

## 2023-09-28 DIAGNOSIS — L810 Postinflammatory hyperpigmentation: Secondary | ICD-10-CM

## 2023-09-28 DIAGNOSIS — L700 Acne vulgaris: Secondary | ICD-10-CM

## 2023-09-28 DIAGNOSIS — L819 Disorder of pigmentation, unspecified: Secondary | ICD-10-CM | POA: Diagnosis not present

## 2023-09-28 MED ORDER — CLINDAMYCIN PHOSPHATE 1 % EX SWAB: 1.0000 | Freq: Two times a day (BID) | CUTANEOUS | 5 refills | Status: AC

## 2023-09-28 NOTE — Progress Notes (Signed)
   New Patient Visit   Subjective  Stacie Garrison is a 38 y.o. female who presents for the following: Patient referred by PCP, patient has noticed pigmentation around neck darker patient states she did not notice this until her PCP pointed it out. Patient denies any other medical problems. Reports pregnancy in 2018 - states worsened after this. Also with acne on back.   Patient accompanied by interpreter.   The following portions of the chart were reviewed this encounter and updated as appropriate: medications, allergies, medical history  Review of Systems:  No other skin or systemic complaints except as noted in HPI or Assessment and Plan.  Objective  Well appearing patient in no apparent distress; mood and affect are within normal limits.  A focused examination was performed of the following areas: Neck, back, chest  Linear hyperpigmented patch of anterior neck creases - Red, inflammatory papules and pustules on back with PIH    Relevant exam findings are noted in the Assessment and Plan.    Assessment & Plan   Acne vulgaris - mild - back with associated PIH  - Chronic and persistent condition with duration or expected duration over one year. Condition is symptomatic and bothersome to patient. Patient is flaring and not currently at treatment goal.  - Discussed various treatment options with patient, as well as need for consistent use for at least 6-12 weeks for full efficacy.  - Reviewed treatment options, including side effects of topical agents, oral antibiotics, OCPs (if female), oral spironolactone (if female), and isotretinoin. Discussed that isotretinoin is the most effective  - After discussion opted to initiate: clindamycin  1% swabs.  Start topical clindamycin  swabs 1% once daily to active areas . - start benzoyl peroxide 5% wash in the morning. Educated patient about proper use and potential side effects, including dryness, irritation, and bleaching of  fabrics.  Hyperpigmentation of anterior neck crease - Advised normal pigmentary changes - - Reviewed sun avoidance practices, including protective clothing and hat, midday sun avoidance, sunscreen SPF>30 applications Q2-3H when in sun especially after sweating - Reviewed signs/symptoms of skin cancer, patient asked come in sooner if these appear. - Patient advised to perform monthly skin exams for any new, changing, or concerning moles      Return if symptoms worsen or fail to improve.  I, Jacquelynn V. Wilfred, CMA, am acting as scribe for Lauraine JAYSON Kanaris, MD .   Documentation: I have reviewed the above documentation for accuracy and completeness, and I agree with the above.  Lauraine JAYSON Kanaris, MD

## 2023-09-28 NOTE — Patient Instructions (Addendum)
 Plan for Acne  In the morning: - Cleanse face with a gentle cleanser OR benzoyl peroxide wash if instructed to use this at your visit(can be purchased over the counter- examples at the bottom) - Wipe face with clindamycin  wipe if prescribed at your visit. This can be used on entire face/chest/back or as a spot treatment to active acne areas  - Apply an oil-free moisturizer  In the evening: - Cleanse face with a regular gentle face wash - Wait for skin to completely dry - Apply a pea sized amount of your retinoid (tretinoin or adapalene) to your finger. Dot this around your face, and then rub in - If your skin gets dry, you can follow this up with an oil-free moisturizer  If you were instructed to take minocycline or doxycycline. This is the antibiotic we talked about that you will take twice per day for a 3 month course. Take the medication with food, and don't lie down right after taking it, because it can give you heart burn if you lie down right away.  How to use your Acne creams  Some creams for acne PREVENT acne and some creams TARGET pimples you can see.  Antibiotic creams (erythromycin, benzoyl peroxide, clindamycin  and sulfur sulfacetamide)  How to apply: generally applied once daily either as a spot treatment or all over the face (see above)  Retinoids (differin/adapalene, retin-A/tretinoin or tazorac) work by PREVENTING acne.  These medicines are good to control acne, but may cause dryness and increase your risk of sunburn.  Do not use if you are PREGNANT or BREAST FEEDING. It takes 4-6 weeks to have results and the acne may worsen in the beginning.  Some retinoids are stronger than others, but are also more irritating. These are prescription topical medications, used to treat acne, sun damage, fine wrinkles, and several other skin changes on the face. Medications in this family include tretinoin/Retin-A, adapalene/Differin, and Tazorac, among others.   A Note on Cosmetic  Use: - Please note, if you are over the age of 74, insurance will typically not cover these medications. If they are recommended to you for non-medically necessary reasons, you may choose to pay for the medication out of pocket. Prices vary, but a tube of generic tretinoin can often be purchased for ~$60-100 (this size often lasts several months). This varies considerably, and we recommended that you check www.goodrx.com for prescription discount coupons and to compare prices across pharmacies.   How to apply: Use at night time (sunlight makes them inactive). If you wash your face at night, let the skin dry for 20 minutes before applying. Apply to all areas that have breakouts (NOT just to pimples you see). Use a PEA-SIZED amount for the entire face (no more) Dot it on your skin and connect the dots Avoid eyes and lips These may cause irritation in the beginning. To decrease irritation, do the following: 1st month: Use twice a week for the first month  2nd month: Use every other night 3rd month and on: Use every night  Cleansing your skin: -   Do not use harsh "acne" soaps and astringents. - Use water to clean your face.  - If you wear make-up, sunscreen or creams, use non-comedogenic (non-pore clogging) gentle moisturizing wash or cream. Examples include: Cetaphil, Neutrogena, Clinique  Moisturizers and sunscreen: Apply a "non-comedogenic" (non-pore clogging) lotion with sunscreen in the morning. You may need to re-apply during the day. Neutrogena, Eucerin, Clinique, Vanicream    If you have dryness  or irritation, try these tips: Decrease use to every other night or twice weekly as tolerated  Wash the retinoid off after 1 hour and apply a "non-comedogenic" (non-pore-clogging) lotion If dryness or irritation continues, stop using the retinoid.   Benzoyl peroxide washes 3-5% (brand name includes Neutragena Clear Pore, CereVe Acne Foaming Cream Cleanser, Differin Cleanser) that you use  in the shower. Can bleach linens (clothes, towels, etc), will NOT bleach your skin or hair). Dry off with a white towel so it doesn't take the color out of clothing and colored towels.         Sunscreen  Who needs sunscreen? Everyone. Sunscreen use can help prevent skin cancer by protecting you from the sun's harmful ultraviolet rays. Anyone can get skin cancer, regardless of age, gender or race. In fact, it is estimated that one in five Americans will develop skin cancer in their lifetime.  Sunscreen alone cannot fully protect you. In addition to wearing sunscreen, dermatologists recommend taking the following steps to protect your skin and find skin cancer early:  Seek shade when appropriate, remembering that the sun's rays are strongest between 10 a.m. and 2 p.m. If your shadow is shorter than you are, seek shade. Dress to protect yourself from the sun by wearing a lightweight long-sleeved shirt, pants, a wide-brimmed hat and sunglasses, when possible.  Use extra caution near water, snow and sand as they reflect the damaging rays of the sun, which can increase your chance of sunburn.  Get vitamin D  safely through a healthy diet that may include vitamin supplements. Don't seek the sun. Avoid tanning beds. Ultraviolet light from the sun and tanning beds can cause skin cancer and wrinkling. If you want to look tan, you may wish to use a self-tanning product, but continue to use sunscreen with it.  When should I use sunscreen? Every day you go outside--even if you're just walking to and from your form of transportation. The sun emits harmful UV rays year-round. Even on cloudy days, up to 80 percent of the sun's harmful UV rays can penetrate your skin. Snow, sand and water increase the need for sunscreen because they reflect the sun's rays.  How much sunscreen should I use, and how often should I apply it? Most people only apply 25-50 percent of the recommended amount of sunscreen. Apply enough  sunscreen to cover all exposed skin. Most adults need about 1 ounce -- or enough to fill a shot glass -- to fully cover their body.  Don't forget to apply to the tops of your feet, your neck, your ears and the top of your head. Apply sunscreen to dry skin 15 minutes before going outdoors.  Skin cancer also can form on the lips. To protect your lips, apply a lip balm or lipstick that contains sunscreen with an SPF of 30 or higher.  When outdoors, reapply sunscreen approximately every two hours, or after swimming or sweating, according to the directions on the bottle.   Broad-spectrum sunscreens protect against both UVA and UVB rays. What is the difference between the rays? Sunlight consists of two types of harmful rays that reach the earth -- UVA rays and UVB rays. Overexposure to either can lead to skin cancer. In addition to causing skin cancer, here's what each of these rays do:  UVA rays (or aging rays) can prematurely age your skin, causing wrinkles and age spots, and can pass through window glass. UVB rays (or burning rays) are the primary cause of sunburn and  are blocked by window glass  There is no safe way to tan. Every time you tan, you damage your skin. As this damage builds, you speed up the aging of your skin and increase your risk for all types of skin cancer.  What is the difference between chemical and physical sunscreens? Chemical sunscreens work like a sponge, absorbing the sun's rays. They contain one or more of the following active ingredients: oxybenzone, avobenzone, octisalate, octocrylene, homosalate and octinoxate. These formulations tend to be easier to rub into the skin without leaving a white residue.   Physical sunscreens work like a shield, sitting sit on the surface of your skin and deflecting the sun's rays. They contain the active ingredients zinc oxide and/or titanium dioxide. Use this sunscreen if you have sensitive skin.   What type of sunscreen should I use? The  best type of sunscreen is the one you will use again and again. Just make sure it offers broad-spectrum (UVA and UVB) protection, has an SPF of 30+, and is water-resistant. The kind of sunscreen you use is a matter of personal choice, and may vary depending on the area of the body to be protected. Available sunscreen options include lotions, creams, gels, ointments, wax sticks and sprays.  Recommended physical sunscreens for face: - Neutrogena Sheer Zinc - Aveeno Positively Mineral Sensitive - CeraVe Hydrating Mineral (also has a tinted version) - La Roche-Posay Anthelios Mineral Face (comes as a cream, lotion, light fluid, and there is also a tinted version).  - EltaMD UV Clear (also has a tinted version)  Recommended physical sunscreens for body: - Neutrogena Sheer Zinc Dry-Touch Sunscreen Sensitive Skin Lotion Broad Spectrum SPF 50 - Aveeno Positively Mineral Sensitive Skin Sunscreen Broad Spectrum SPF 50 - La Roche-Posay Anthelios SPF 50 Mineral Sunscreen - Gentle Lotion - CeraVe Hydrating Mineral Sunscreen SPF 50  Recommended chemical sunscreens for face: - Anthelios UV Correct Face Sunscreen SPF 70 with Niacinamide - Neutrogena Clear Face Oil-Free SPF 50 with Helioplex - Neutrogena Sport Face Oil-Free SPF 70+ with Helioplex - Aveeno Protect + Hydrate Sunscreen For Face SPF 70 - La Roche-Posay Anthelios Light Fluid Sunscreen for Face SPF 60  Recommended chemical sunscreens for body: - Neutrogena Ultra Sheer Dry-Touch Sunscreen SPF 70 - Aveeno Protect + Hydrate Broad Spectrum All-Day Hydration SPF 60 (comes in a big pump) - La Roche-Posay Anthelios Melt-In Milk Sunscreen SPF 60    Due to recent changes in healthcare laws, you may see results of your pathology and/or laboratory studies on MyChart before the doctors have had a chance to review them. We understand that in some cases there may be results that are confusing or concerning to you. Please understand that not all results are  received at the same time and often the doctors may need to interpret multiple results in order to provide you with the best plan of care or course of treatment. Therefore, we ask that you please give us  2 business days to thoroughly review all your results before contacting the office for clarification. Should we see a critical lab result, you will be contacted sooner.   If You Need Anything After Your Visit  If you have any questions or concerns for your doctor, please call our main line at [phone] and press option 4 to reach your doctor's medical assistant. If no one answers, please leave a voicemail as directed and we will return your call as soon as possible. Messages left after 4 pm will be answered the following business  day.   You may also send us  a message via MyChart. We typically respond to MyChart messages within 1-2 business days.  For prescription refills, please ask your pharmacy to contact our office. Our fax number is [phone].  If you have an urgent issue when the clinic is closed that cannot wait until the next business day, you can page your doctor at the number below.    Please note that while we do our best to be available for urgent issues outside of office hours, we are not available 24/7.   If you have an urgent issue and are unable to reach us , you may choose to seek medical care at your doctor's office, retail clinic, urgent care center, or emergency room.  If you have a medical emergency, please immediately call 911 or go to the emergency department.  Pager Numbers  - Dr. Hester: [phone]  - Dr. Jackquline: [phone]  - Dr. Claudene: [phone]   In the event of inclement weather, please call our main line at [phone] for an update on the status of any delays or closures.  Dermatology Medication Tips: Please keep the boxes that topical medications come in in order to help keep track of the instructions about where and how to use these.  Pharmacies typically print the medication instructions only on the boxes and not directly on the medication tubes.   If your medication is too expensive, please contact our office at [phone] option 4 or send us  a message through MyChart.   We are unable to tell what your co-pay for medications will be in advance as this is different depending on your insurance coverage. However, we may be able to find a substitute medication at lower cost or fill out paperwork to get insurance to cover a needed medication.   If a prior authorization is required to get your medication covered by your insurance company, please allow us  1-2 business days to complete this process.  Drug prices often vary depending on where the prescription is filled and some pharmacies may offer cheaper prices.  The website www.goodrx.com contains coupons for medications through different pharmacies. The prices here do not account for what the cost may be with help from insurance (it may be cheaper with your insurance), but the website can give you the price if you did not use any insurance.  - You can print the associated coupon and take it with your prescription to the pharmacy.  - You may also stop by our office during regular business hours and pick up a GoodRx coupon card.  - If you need your prescription sent electronically to a different pharmacy, notify our office through [facility] or by phone at [phone] option 4.     Si Usted Necesita Algo Despus de Su Visita  Tambin puede enviarnos un mensaje a travs de Clinical cytogeneticist. Por lo general respondemos a los mensajes de MyChart en el transcurso de 1 a 2 das hbiles.  Para renovar recetas, por favor pida a su farmacia que se ponga en contacto con nuestra oficina. Randi lakes de fax es [place] [phone].  Si tiene un asunto urgente cuando la clnica est cerrada y que no puede esperar hasta el siguiente da hbil, puede llamar/localizar a su doctor(a) al nmero  que aparece a continuacin.   Por favor, tenga en cuenta que aunque hacemos todo lo posible para estar disponibles para asuntos urgentes fuera del horario de [place], no estamos disponibles las 24 horas del da, los [address]  de la semana.   Si tiene un problema urgente y no puede comunicarse con nosotros, puede optar por buscar atencin mdica  en el consultorio de su doctor(a), en una clnica privada, en un centro de atencin urgente o en una sala de emergencias.  Si tiene Engineer, drilling, por favor llame inmediatamente al 911 o vaya a la sala de emergencias.  Nmeros de bper  - Dr. Hester: [phone]  - Dra. Jackquline: [phone]  - Dr. Claudene: [phone]   En caso de inclemencias del tiempo, por favor llame a landry capes principal al [phone] para una actualizacin sobre el [place] de cualquier retraso o cierre.  Consejos para la medicacin en dermatologa: Por favor, guarde las cajas en las que vienen los medicamentos de uso tpico para ayudarle a seguir las instrucciones sobre dnde y cmo usarlos. Las farmacias generalmente imprimen las instrucciones del medicamento slo en las cajas y no directamente en los tubos del [place].   Si su medicamento es muy caro, por favor, pngase en contacto con landry rieger llamando al [phone] y presione la opcin 4 o envenos un mensaje a travs de Clinical cytogeneticist.   No podemos decirle cul ser su copago por los medicamentos por adelantado ya que esto es diferente dependiendo de la cobertura de su seguro. Sin embargo, es posible que podamos encontrar un medicamento sustituto a Audiological scientist un formulario para que el seguro cubra el medicamento que se considera necesario.   Si se requiere una autorizacin previa para que su compaa de seguros malta su medicamento, por favor permtanos de 1 a 2 das hbiles para completar este proceso.  Los precios de los medicamentos varan con frecuencia dependiendo del Environmental consultant de dnde se  surte la receta y alguna farmacias pueden ofrecer precios ms baratos.  El sitio web www.goodrx.com tiene cupones para medicamentos de Health and safety inspector. Los precios aqu no tienen en cuenta lo que podra costar con la ayuda del seguro (puede ser ms barato con su seguro), pero el sitio web puede darle el precio si no utiliz Tourist information centre manager.  - Puede imprimir el cupn correspondiente y llevarlo con su receta a la farmacia.  - Tambin puede pasar por nuestra oficina durante el horario de atencin regular y Education officer, museum una tarjeta de cupones de GoodRx.  - Si necesita que su receta se enve electrnicamente a una farmacia diferente, informe a nuestra oficina a travs de MyChart de [place] o por telfono llamando al [phone] y presione la opcin 4.

## 2023-10-06 ENCOUNTER — Encounter (INDEPENDENT_AMBULATORY_CARE_PROVIDER_SITE_OTHER): Admitting: Physician Assistant

## 2023-10-06 NOTE — Progress Notes (Deleted)
 Complete physical exam  Patient: Stacie Garrison   DOB: 07-Mar-1985   38 y.o. Female  MRN: [id number] Visit Date: 10/06/2023  Today's healthcare provider: Jolynn Spencer, PA-C   No chief complaint on file.  Subjective    Stacie Garrison is a 38 y.o. female who presents today for a complete physical exam.  She reports consuming a {diet types:17450} diet. {Exercise:19826} She generally feels {well/fairly well/poorly:18703}. She reports sleeping {well/fairly well/poorly:18703}. She {does/does not:200015} have additional problems to discuss today.  HPI  *** Discussed the use of AI scribe software for clinical note transcription with the patient, who gave verbal consent to proceed.  History of Present Illness     Last depression screening scores    08/28/2023    2:13 PM 07/28/2023    1:19 PM  PHQ 2/9 Scores  PHQ - 2 Score 2 1  PHQ- 9 Score 5 3   Last fall risk screening     No data to display         Last Audit-C alcohol use screening    08/28/2023    2:08 PM  Alcohol Use Disorder Test (AUDIT)  1. How often do you have a drink containing alcohol? 0  2. How many drinks containing alcohol do you have on a typical day when you are drinking? 0  3. How often do you have six or more drinks on one occasion? 0  AUDIT-C Score 0   A score of 3 or more in women, and 4 or more in men indicates increased risk for alcohol abuse, EXCEPT if all of the points are from question 1   Past Medical History:  Diagnosis Date  . COVID-19 02/02/2020   Symptomatic since about 01/29/2020, tested + in [facility] ED on 02/02/2020  . Medical history non-contributory    Past Surgical History:  Procedure Laterality Date  . APPENDECTOMY     Social History   Socioeconomic History  . Marital status: Married    Spouse name: Not on file  . Number of children: Not on file  . Years of education: Not on file  . Highest education level: Not on file  Occupational History  . Not on file  Tobacco Use  . Smoking  status: Never  . Smokeless tobacco: Never  Vaping Use  . Vaping status: Never Used  Substance and Sexual Activity  . Alcohol use: No  . Drug use: No  . Sexual activity: Yes    Birth control/protection: I.U.D.    Comment: Mirena   Other Topics Concern  . Not on file  Social History Narrative  . Not on file   Social Drivers of Health   Financial Resource Strain: Medium Risk (08/28/2023)   Overall Financial Resource Strain (CARDIA)   . Difficulty of Paying Living Expenses: Somewhat hard  Food Insecurity: No Food Insecurity (08/28/2023)   Hunger Vital Sign   . Worried About Programme researcher, broadcasting/film/video in the Last Year: Never true   . Ran Out of Food in the Last Year: Never true  Transportation Needs: No Transportation Needs (08/28/2023)   PRAPARE - Transportation   . Lack of Transportation (Medical): No   . Lack of Transportation (Non-Medical): No  Physical Activity: Sufficiently Active (08/28/2023)   Exercise Vital Sign   . Days of Exercise per Week: 5 days   . Minutes of Exercise per Session: 60 min  Stress: No Stress Concern Present (08/28/2023)   [employer] of Occupational Health - Occupational Stress Questionnaire   .  Feeling of Stress: Only a little  Social Connections: Not on file  Intimate Partner Violence: Not At Risk (08/28/2023)   Humiliation, Afraid, Rape, and Kick questionnaire   . Fear of Current or Ex-Partner: No   . Emotionally Abused: No   . Physically Abused: No   . Sexually Abused: No   Family Status  Relation Name Status  . Mother  Alive  . MGM  Deceased  . Arzella Pries 1st Deceased  No partnership data on file   Family History  Problem Relation Age of Onset  . Hypertension Mother   . Hypertension Maternal Grandmother   . Heart Problems Maternal Grandmother   . Lung cancer Cousin    No Known Allergies  Patient Care Team: Ericka Marcellus, PA-C as PCP - General (Physician Assistant)   Medications: Outpatient Medications Prior to Visit  Medication Sig  .  clindamycin  (CLEOCIN  T) 1 % SWAB Apply 1 Application topically 2 (two) times daily. Apply to the affected area of skin once daily  . norelgestromin -ethinyl estradiol  (XULANE) 150-35 MCG/24HR transdermal patch Place 1 patch onto the skin once a week. Apply 1 patch weekly for 3 weeks, then 1 week without patch   No facility-administered medications prior to visit.    Review of Systems  All other systems reviewed and are negative.  Except see HPI  {Insert previous labs (optional):23779} {See past labs  Heme  Chem  Endocrine  Serology  Results Review (optional):1}  Objective    There were no vitals taken for this visit. {Insert last BP/Wt (optional):23777}{See vitals history (optional):1}    Physical Exam Vitals reviewed.  Constitutional:      General: She is not in acute distress.    Appearance: Normal appearance. She is well-developed. She is not ill-appearing, toxic-appearing or diaphoretic.  HENT:     Head: Normocephalic and atraumatic.     Right Ear: Tympanic membrane, ear canal and external ear normal.     Left Ear: Tympanic membrane, ear canal and external ear normal.     Nose: Nose normal. No congestion or rhinorrhea.     Mouth/Throat:     Mouth: Mucous membranes are moist.     Pharynx: Oropharynx is clear. No oropharyngeal exudate.  Eyes:     General: No scleral icterus.       Right eye: No discharge.        Left eye: No discharge.     Conjunctiva/sclera: Conjunctivae normal.     Pupils: Pupils are equal, round, and reactive to light.  Neck:     Thyroid : No thyromegaly.     Vascular: No carotid bruit.  Cardiovascular:     Rate and Rhythm: Normal rate and regular rhythm.     Pulses: Normal pulses.     Heart sounds: Normal heart sounds. No murmur heard.    No friction rub. No gallop.  Pulmonary:     Effort: Pulmonary effort is normal. No respiratory distress.     Breath sounds: Normal breath sounds. No wheezing or rales.  Abdominal:     General: Abdomen is  flat. Bowel sounds are normal. There is no distension.     Palpations: Abdomen is soft. There is no mass.     Tenderness: There is no abdominal tenderness. There is no right CVA tenderness, left CVA tenderness, guarding or rebound.     Hernia: No hernia is present.  Musculoskeletal:        General: No swelling, tenderness, deformity or signs of injury. Normal range of  motion.     Cervical back: Normal range of motion and neck supple. No rigidity or tenderness.     Right lower leg: No edema.     Left lower leg: No edema.  Lymphadenopathy:     Cervical: No cervical adenopathy.  Skin:    General: Skin is warm and dry.     Coloration: Skin is not jaundiced or pale.     Findings: No bruising, erythema, lesion or rash.  Neurological:     Mental Status: She is alert and oriented to person, place, and time. Mental status is at baseline.     Gait: Gait normal.  Psychiatric:        Mood and Affect: Mood normal.        Behavior: Behavior normal.        Thought Content: Thought content normal.        Judgment: Judgment normal.     No results found for any visits on 10/06/23.  Assessment & Plan    Routine Health Maintenance and Physical Exam  Exercise Activities and Dietary recommendations  Goals   None     Immunization History  Administered Date(s) Administered  . HPV 9-valent 08/28/2023  . Hepatitis B, ADULT 05/17/2012, 06/16/2012, 03/10/2013  . Influenza,inj,Quad PF,6+ Mos 05/17/2012, 03/10/2013  . MMR 01/23/2014  . PFIZER(Purple Top)SARS-COV-2 Vaccination 04/18/2019, 05/10/2019  . Td (Adult),5 Lf Tetanus Toxid, Preservative Free 01/23/2014  . Tdap 03/10/2013, 07/18/2020    Health Maintenance  Topic Date Due  . Hepatitis C Screening  Never done  . COVID-19 Vaccine (3 - 2025-26 season) 09/21/2023  . Flu Shot  04/19/2024*  . HPV Vaccine (2 - 3-dose SCDM series) 08/27/2024*  . Pap with HPV screening  04/13/2028  . DTaP/Tdap/Td vaccine (4 - Td or Tdap) 07/19/2030  . Hepatitis  B Vaccine  Completed  . HIV Screening  Completed  . Pneumococcal Vaccine  Aged Out  . Meningitis B Vaccine  Aged Out  *Topic was postponed. The date shown is not the original due date.    Discussed health benefits of physical activity, and encouraged her to engage in regular exercise appropriate for her age and condition.  Assessment and Plan Assessment & Plan      ***  No follow-ups on file.    The patient was advised to call back or seek an in-person evaluation if the symptoms worsen or if the condition fails to improve as anticipated.  I discussed the assessment and treatment plan with the patient. The patient was provided an opportunity to ask questions and all were answered. The patient agreed with the plan and demonstrated an understanding of the instructions.  I, Zarif Rathje, PA-C have reviewed all documentation for this visit. The documentation on 10/06/2023  for the exam, diagnosis, procedures, and orders are all accurate and complete.  Jolynn Spencer, [facility], MMS [facility] [phone] (phone) [phone] (fax)  [facility]

## 2023-10-12 ENCOUNTER — Ambulatory Visit (INDEPENDENT_AMBULATORY_CARE_PROVIDER_SITE_OTHER): Admitting: Physician Assistant

## 2023-10-12 ENCOUNTER — Other Ambulatory Visit (HOSPITAL_COMMUNITY)
Admission: RE | Admit: 2023-10-12 | Discharge: 2023-10-12 | Disposition: A | Discharge status: Home / Self Care | Source: Ambulatory Visit | Attending: Physician Assistant | Admitting: Physician Assistant

## 2023-10-12 ENCOUNTER — Encounter: Payer: Self-pay | Admitting: Physician Assistant

## 2023-10-12 VITALS — BP 99/71 | HR 87 | Resp 14 | Ht 65.0 in | Wt 115.0 lb

## 2023-10-12 DIAGNOSIS — N390 Urinary tract infection, site not specified: Secondary | ICD-10-CM | POA: Insufficient documentation

## 2023-10-12 DIAGNOSIS — Z603 Acculturation difficulty: Secondary | ICD-10-CM

## 2023-10-12 DIAGNOSIS — Z0000 Encounter for general adult medical examination without abnormal findings: Secondary | ICD-10-CM | POA: Insufficient documentation

## 2023-10-12 DIAGNOSIS — N898 Other specified noninflammatory disorders of vagina: Secondary | ICD-10-CM | POA: Diagnosis not present

## 2023-10-12 DIAGNOSIS — R319 Hematuria, unspecified: Secondary | ICD-10-CM | POA: Insufficient documentation

## 2023-10-12 DIAGNOSIS — Z758 Other problems related to medical facilities and other health care: Secondary | ICD-10-CM

## 2023-10-12 NOTE — Progress Notes (Signed)
 Complete physical exam  Patient: Stacie Garrison   DOB: 1985/11/14   38 y.o. Female  MRN: [id number] Visit Date: 10/12/2023  Today's healthcare provider: Jolynn Spencer, PA-C   Chief Complaint  Patient presents with   Annual Exam    Sleep of pattern: 7-8 hours of sleep Exercise: Jog in the mornings; 4-5 days a week No concerns   Subjective    Stacie Garrison is a 38 y.o. female who presents today for a complete physical exam.   Discussed the use of AI scribe software for clinical note transcription with the patient, who gave verbal consent to proceed.  History of Present Illness Stacie Garrison is a 38 year old female who presents for follow-up of urinary tract infection and bacterial vaginosis.  She has experienced hematuria during previous UTI episodes. Currently, she is at the end of her menstrual cycle, which typically lasts 4-5 days, and is concerned about residual menstrual flow affecting the timing of her follow-up tests. She reports no current vaginal discharge or pain, and no issues with bowel movements, urination, or her menstrual period.    Last depression screening scores    08/28/2023    2:13 PM 07/28/2023    1:19 PM  PHQ 2/9 Scores  PHQ - 2 Score 2 1  PHQ- 9 Score 5 3   Last fall risk screening    10/12/2023    9:54 AM  Fall Risk   Falls in the past year? 0  Number falls in past yr: 0  Injury with Fall? 0  Risk for fall due to : No Fall Risks  Follow up Falls evaluation completed   Last Audit-C alcohol use screening    08/28/2023    2:08 PM  Alcohol Use Disorder Test (AUDIT)  1. How often do you have a drink containing alcohol? 0  2. How many drinks containing alcohol do you have on a typical day when you are drinking? 0  3. How often do you have six or more drinks on one occasion? 0  AUDIT-C Score 0   A score of 3 or more in women, and 4 or more in men indicates increased risk for alcohol abuse, EXCEPT if all of the points are from question 1   Past Medical  History:  Diagnosis Date   COVID-19 02/02/2020   Symptomatic since about 01/29/2020, tested + in [facility] ED on 02/02/2020   Medical history non-contributory    Past Surgical History:  Procedure Laterality Date   APPENDECTOMY     Social History   Socioeconomic History   Marital status: Married    Spouse name: Not on file   Number of children: Not on file   Years of education: Not on file   Highest education level: Not on file  Occupational History   Not on file  Tobacco Use   Smoking status: Never   Smokeless tobacco: Never  Vaping Use   Vaping status: Never Used  Substance and Sexual Activity   Alcohol use: No   Drug use: No   Sexual activity: Yes    Birth control/protection: I.U.D.    Comment: Mirena   Other Topics Concern   Not on file  Social History Narrative   Not on file   Social Drivers of Health   Financial Resource Strain: Medium Risk (08/28/2023)   Overall Financial Resource Strain (CARDIA)    Difficulty of Paying Living Expenses: Somewhat hard  Food Insecurity: No Food Insecurity (08/28/2023)   Hunger  Vital Sign    Worried About Programme researcher, broadcasting/film/video in the Last Year: Never true    Ran Out of Food in the Last Year: Never true  Transportation Needs: No Transportation Needs (08/28/2023)   PRAPARE - Administrator, Civil Service (Medical): No    Lack of Transportation (Non-Medical): No  Physical Activity: Sufficiently Active (08/28/2023)   Exercise Vital Sign    Days of Exercise per Week: 5 days    Minutes of Exercise per Session: 60 min  Stress: No Stress Concern Present (08/28/2023)   [employer] of Occupational Health - Occupational Stress Questionnaire    Feeling of Stress: Only a little  Social Connections: Not on file  Intimate Partner Violence: Not At Risk (08/28/2023)   Humiliation, Afraid, Rape, and Kick questionnaire    Fear of Current or Ex-Partner: No    Emotionally Abused: No    Physically Abused: No    Sexually Abused: No   Family  Status  Relation Name Status   Mother  Alive   MGM  Deceased   Arzella Pries 1st Deceased  No partnership data on file   Family History  Problem Relation Age of Onset   Hypertension Mother    Hypertension Maternal Grandmother    Heart Problems Maternal Grandmother    Lung cancer Cousin    No Known Allergies  Patient Care Team: Mohamed Portlock, PA-C as PCP - General (Physician Assistant)   Medications: Outpatient Medications Prior to Visit  Medication Sig   clindamycin  (CLEOCIN  T) 1 % SWAB Apply 1 Application topically 2 (two) times daily. Apply to the affected area of skin once daily   norelgestromin -ethinyl estradiol  (XULANE) 150-35 MCG/24HR transdermal patch Place 1 patch onto the skin once a week. Apply 1 patch weekly for 3 weeks, then 1 week without patch   No facility-administered medications prior to visit.    Review of Systems  All other systems reviewed and are negative.  Except see HPI     Objective    BP 99/71   Pulse 87   Resp 14   Ht 5' 5 (1.651 m)   Wt 115 lb (52.2 kg)   SpO2 100%   BMI 19.14 kg/m      Physical Exam Vitals reviewed.  Constitutional:      General: She is not in acute distress.    Appearance: Normal appearance. She is well-developed. She is not diaphoretic.  HENT:     Head: Normocephalic and atraumatic.  Eyes:     General: No scleral icterus.    Conjunctiva/sclera: Conjunctivae normal.  Neck:     Thyroid : No thyromegaly.  Cardiovascular:     Rate and Rhythm: Normal rate and regular rhythm.     Pulses: Normal pulses.     Heart sounds: Normal heart sounds. No murmur heard. Pulmonary:     Effort: Pulmonary effort is normal. No respiratory distress.     Breath sounds: Normal breath sounds. No wheezing, rhonchi or rales.  Musculoskeletal:     Cervical back: Neck supple.     Right lower leg: No edema.     Left lower leg: No edema.  Lymphadenopathy:     Cervical: No cervical adenopathy.  Skin:    General: Skin is warm and  dry.     Findings: No rash.  Neurological:     Mental Status: She is alert and oriented to person, place, and time. Mental status is at baseline.  Psychiatric:  Mood and Affect: Mood normal.        Behavior: Behavior normal.      No results found for any visits on 10/12/23.  Assessment & Plan    Routine Health Maintenance and Physical Exam  Exercise Activities and Dietary recommendations  Goals   None     Immunization History  Administered Date(s) Administered   HPV 9-valent 08/28/2023   Hepatitis B, ADULT 05/17/2012, 06/16/2012, 03/10/2013   Influenza,inj,Quad PF,6+ Mos 05/17/2012, 03/10/2013   MMR 01/23/2014   PFIZER(Purple Top)SARS-COV-2 Vaccination 04/18/2019, 05/10/2019   Td (Adult),5 Lf Tetanus Toxid, Preservative Free 01/23/2014   Tdap 03/10/2013, 07/18/2020    Health Maintenance  Topic Date Due   Hepatitis C Screening  Never done   COVID-19 Vaccine (3 - 2025-26 season) 09/21/2023   Flu Shot  04/19/2024*   HPV Vaccine (2 - 3-dose SCDM series) 08/27/2024*   Pap with HPV screening  04/13/2028   DTaP/Tdap/Td vaccine (4 - Td or Tdap) 07/19/2030   Hepatitis B Vaccine  Completed   HIV Screening  Completed   Pneumococcal Vaccine  Aged Out   Meningitis B Vaccine  Aged Out  *Topic was postponed. The date shown is not the original due date.    Discussed health benefits of physical activity, and encouraged her to engage in regular exercise appropriate for her age and condition.   Assessment & Plan Urinary tract infection and bacterial vaginosis Previous UTI and bacterial vaginosis with hematuria requiring further investigation. - Order vaginal swab post-menstruation. - Order two urine tests to assess for UTI and other causes of hematuria. - Schedule nurse visit for lab work on Wednesday.  Vaginal discharge - Cervicovaginal ancillary only - Urinalysis, Routine w reflex microscopic  Urinary tract infection with hematuria, site unspecified - Cervicovaginal  ancillary only - Urinalysis, Routine w reflex microscopic - Urine Culture  Language barrier affecting health care Riccardo [phone]  Annual physical exam Well adult exam with abnormal findings Things to do to keep yourself healthy  - Exercise at least 30-45 minutes a day, 3-4 days a week.  - Eat a low-fat diet with lots of fruits and vegetables, up to 7-9 servings per day.  - Seatbelts can save your life. Wear them always.  - Smoke detectors on every level of your home, check batteries every year.  - Eye Doctor - have an eye exam every 1-2 years  - Safe sex - if you may be exposed to STDs, use a condom.  - Alcohol -  If you drink, do it moderately, less than 2 drinks per day.  - Health Care Power of Attorney. Choose someone to speak for you if you are not able.  - Depression is common in our stressful world.If you're feeling down or losing interest in things you normally enjoy, please come in for a visit.  - Violence - If anyone is threatening or hurting you, please call immediately.  No follow-ups on file.    The patient was advised to call back or seek an in-person evaluation if the symptoms worsen or if the condition fails to improve as anticipated.  I discussed the assessment and treatment plan with the patient. The patient was provided an opportunity to ask questions and all were answered. The patient agreed with the plan and demonstrated an understanding of the instructions.  I, Alliya Marcon, PA-C have reviewed all documentation for this visit. The documentation on 10/12/2023  for the exam, diagnosis, procedures, and orders are all accurate and complete.  Wright Gravely,  [facility], MMS [facility] [phone] (phone) [phone] (fax)  [facility]

## 2023-10-15 LAB — CERVICOVAGINAL ANCILLARY ONLY
Bacterial Vaginitis (gardnerella): NEGATIVE
Candida Glabrata: NEGATIVE
Candida Vaginitis: NEGATIVE
Comment: NEGATIVE
Comment: NEGATIVE
Comment: NEGATIVE

## 2023-10-15 LAB — MICROSCOPIC EXAMINATION
Bacteria, UA: NONE SEEN
Casts: NONE SEEN /LPF
RBC, Urine: NONE SEEN /HPF (ref 0–2)

## 2023-10-15 LAB — URINALYSIS, ROUTINE W REFLEX MICROSCOPIC
Bilirubin, UA: NEGATIVE
Glucose, UA: NEGATIVE
Nitrite, UA: NEGATIVE
RBC, UA: NEGATIVE
Specific Gravity, UA: 1.028 (ref 1.005–1.030)
Urobilinogen, Ur: 0.2 mg/dL (ref 0.2–1.0)
pH, UA: 6 (ref 5.0–7.5)

## 2023-10-15 LAB — SPECIMEN STATUS REPORT

## 2023-10-16 LAB — SPECIMEN STATUS REPORT

## 2023-10-16 LAB — URINE CULTURE

## 2023-10-19 ENCOUNTER — Ambulatory Visit: Payer: Self-pay | Admitting: Physician Assistant

## 2024-10-17 ENCOUNTER — Encounter: Admitting: Physician Assistant
